# Patient Record
Sex: Female | Born: 1980
Health system: Southern US, Community
[De-identification: ages and names within clinical notes are randomized; demographics above are authoritative.]

## PROBLEM LIST (undated history)

## (undated) ENCOUNTER — Inpatient Hospital Stay (HOSPITAL_COMMUNITY): Payer: Self-pay

## (undated) DIAGNOSIS — O139 Gestational [pregnancy-induced] hypertension without significant proteinuria, unspecified trimester: Secondary | ICD-10-CM

## (undated) DIAGNOSIS — F419 Anxiety disorder, unspecified: Secondary | ICD-10-CM

## (undated) DIAGNOSIS — B009 Herpesviral infection, unspecified: Secondary | ICD-10-CM

## (undated) DIAGNOSIS — N879 Dysplasia of cervix uteri, unspecified: Secondary | ICD-10-CM

## (undated) DIAGNOSIS — F32A Depression, unspecified: Secondary | ICD-10-CM

## (undated) DIAGNOSIS — R87629 Unspecified abnormal cytological findings in specimens from vagina: Secondary | ICD-10-CM

## (undated) DIAGNOSIS — Z789 Other specified health status: Secondary | ICD-10-CM

## (undated) DIAGNOSIS — Z8619 Personal history of other infectious and parasitic diseases: Secondary | ICD-10-CM

## (undated) DIAGNOSIS — L309 Dermatitis, unspecified: Secondary | ICD-10-CM

## (undated) HISTORY — DX: Dysplasia of cervix uteri, unspecified: N87.9

## (undated) HISTORY — DX: Dermatitis, unspecified: L30.9

## (undated) HISTORY — PX: COLPOSCOPY: SHX161

## (undated) HISTORY — DX: Herpesviral infection, unspecified: B00.9

## (undated) HISTORY — PX: ESOPHAGOGASTRODUODENOSCOPY: SHX1529

---
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
## 2002-06-10 DIAGNOSIS — N879 Dysplasia of cervix uteri, unspecified: Secondary | ICD-10-CM

## 2002-06-10 HISTORY — DX: Dysplasia of cervix uteri, unspecified: N87.9

## 2003-03-10 ENCOUNTER — Emergency Department (HOSPITAL_COMMUNITY): Admission: EM | Admit: 2003-03-10 | Discharge: 2003-03-11 | Payer: Self-pay | Admitting: *Deleted

## 2003-05-04 ENCOUNTER — Emergency Department (HOSPITAL_COMMUNITY): Admission: EM | Admit: 2003-05-04 | Discharge: 2003-05-05 | Payer: Self-pay | Admitting: Emergency Medicine

## 2004-07-03 ENCOUNTER — Emergency Department (HOSPITAL_COMMUNITY): Admission: EM | Admit: 2004-07-03 | Discharge: 2004-07-03 | Payer: Self-pay | Admitting: Emergency Medicine

## 2004-07-20 ENCOUNTER — Inpatient Hospital Stay (HOSPITAL_COMMUNITY): Admission: AD | Admit: 2004-07-20 | Discharge: 2004-07-20 | Payer: Self-pay | Admitting: Family Medicine

## 2005-01-14 ENCOUNTER — Inpatient Hospital Stay (HOSPITAL_COMMUNITY): Admission: AD | Admit: 2005-01-14 | Discharge: 2005-01-14 | Payer: Self-pay | Admitting: Obstetrics & Gynecology

## 2005-02-25 ENCOUNTER — Emergency Department (HOSPITAL_COMMUNITY): Admission: EM | Admit: 2005-02-25 | Discharge: 2005-02-25 | Payer: Self-pay | Admitting: Emergency Medicine

## 2005-02-28 ENCOUNTER — Emergency Department (HOSPITAL_COMMUNITY): Admission: EM | Admit: 2005-02-28 | Discharge: 2005-02-28 | Payer: Self-pay | Admitting: *Deleted

## 2005-03-03 ENCOUNTER — Emergency Department (HOSPITAL_COMMUNITY): Admission: EM | Admit: 2005-03-03 | Discharge: 2005-03-03 | Payer: Self-pay | Admitting: Family Medicine

## 2005-04-23 ENCOUNTER — Ambulatory Visit: Payer: Self-pay | Admitting: Internal Medicine

## 2005-06-11 ENCOUNTER — Ambulatory Visit: Payer: Self-pay | Admitting: Internal Medicine

## 2005-07-16 ENCOUNTER — Inpatient Hospital Stay (HOSPITAL_COMMUNITY): Admission: AD | Admit: 2005-07-16 | Discharge: 2005-07-16 | Payer: Self-pay | Admitting: Family Medicine

## 2005-11-28 ENCOUNTER — Emergency Department (HOSPITAL_COMMUNITY): Admission: EM | Admit: 2005-11-28 | Discharge: 2005-11-28 | Payer: Self-pay | Admitting: Family Medicine

## 2006-03-28 ENCOUNTER — Inpatient Hospital Stay (HOSPITAL_COMMUNITY): Admission: AD | Admit: 2006-03-28 | Discharge: 2006-03-28 | Payer: Self-pay | Admitting: Obstetrics & Gynecology

## 2006-07-28 ENCOUNTER — Emergency Department (HOSPITAL_COMMUNITY): Admission: EM | Admit: 2006-07-28 | Discharge: 2006-07-28 | Payer: Self-pay | Admitting: Family Medicine

## 2006-07-29 ENCOUNTER — Emergency Department (HOSPITAL_COMMUNITY): Admission: EM | Admit: 2006-07-29 | Discharge: 2006-07-29 | Payer: Self-pay | Admitting: Emergency Medicine

## 2007-04-10 ENCOUNTER — Emergency Department (HOSPITAL_COMMUNITY): Admission: EM | Admit: 2007-04-10 | Discharge: 2007-04-10 | Payer: Self-pay | Admitting: Emergency Medicine

## 2007-06-01 DIAGNOSIS — J301 Allergic rhinitis due to pollen: Secondary | ICD-10-CM

## 2007-07-20 ENCOUNTER — Emergency Department (HOSPITAL_COMMUNITY): Admission: EM | Admit: 2007-07-20 | Discharge: 2007-07-20 | Payer: Self-pay | Admitting: Emergency Medicine

## 2007-08-28 ENCOUNTER — Emergency Department (HOSPITAL_COMMUNITY): Admission: EM | Admit: 2007-08-28 | Discharge: 2007-08-28 | Payer: Self-pay | Admitting: General Surgery

## 2007-11-13 ENCOUNTER — Emergency Department (HOSPITAL_COMMUNITY): Admission: EM | Admit: 2007-11-13 | Discharge: 2007-11-13 | Payer: Self-pay | Admitting: Emergency Medicine

## 2008-07-30 ENCOUNTER — Emergency Department (HOSPITAL_COMMUNITY): Admission: EM | Admit: 2008-07-30 | Discharge: 2008-07-30 | Payer: Self-pay | Admitting: Emergency Medicine

## 2008-12-18 ENCOUNTER — Emergency Department (HOSPITAL_COMMUNITY): Admission: EM | Admit: 2008-12-18 | Discharge: 2008-12-19 | Payer: Self-pay | Admitting: Emergency Medicine

## 2009-12-04 ENCOUNTER — Encounter: Admission: RE | Admit: 2009-12-04 | Discharge: 2009-12-04 | Payer: Self-pay | Admitting: Specialist

## 2010-09-13 ENCOUNTER — Inpatient Hospital Stay (INDEPENDENT_AMBULATORY_CARE_PROVIDER_SITE_OTHER)
Admission: RE | Admit: 2010-09-13 | Discharge: 2010-09-13 | Disposition: A | Discharge status: Home / Self Care | Payer: 59 | Source: Ambulatory Visit | Attending: Family Medicine | Admitting: Family Medicine

## 2010-09-13 DIAGNOSIS — N390 Urinary tract infection, site not specified: Secondary | ICD-10-CM

## 2010-09-13 LAB — POCT URINALYSIS DIP (DEVICE)
Nitrite: NEGATIVE
Protein, ur: NEGATIVE mg/dL
Urobilinogen, UA: 0.2 mg/dL (ref 0.0–1.0)
pH: 5.5 (ref 5.0–8.0)

## 2010-09-13 LAB — POCT PREGNANCY, URINE: Preg Test, Ur: NEGATIVE

## 2010-09-15 LAB — URINE CULTURE

## 2011-03-01 LAB — CBC
Hemoglobin: 13.9
MCHC: 33.9
MCV: 89.9
Platelets: 235
RDW: 13.6
WBC: 5

## 2011-03-01 LAB — COMPREHENSIVE METABOLIC PANEL
ALT: 9
AST: 14
Alkaline Phosphatase: 62
BUN: 12
Creatinine, Ser: 0.92
Total Bilirubin: 0.8
Total Protein: 7.2

## 2011-03-01 LAB — DIFFERENTIAL
Eosinophils Relative: 1
Monocytes Absolute: 0.5
Monocytes Relative: 9
Neutro Abs: 3.1

## 2011-03-01 LAB — GC/CHLAMYDIA PROBE AMP, GENITAL: GC Probe Amp, Genital: NEGATIVE

## 2011-03-01 LAB — URINALYSIS, ROUTINE W REFLEX MICROSCOPIC
Bilirubin Urine: NEGATIVE
Glucose, UA: NEGATIVE
Nitrite: NEGATIVE
Protein, ur: NEGATIVE

## 2011-03-01 LAB — URINE MICROSCOPIC-ADD ON

## 2011-03-01 LAB — POCT PREGNANCY, URINE: Preg Test, Ur: NEGATIVE

## 2011-03-01 LAB — WET PREP, GENITAL: Yeast Wet Prep HPF POC: NONE SEEN

## 2011-03-07 LAB — POCT RAPID STREP A: Streptococcus, Group A Screen (Direct): NEGATIVE

## 2011-03-27 ENCOUNTER — Inpatient Hospital Stay (INDEPENDENT_AMBULATORY_CARE_PROVIDER_SITE_OTHER)
Admission: RE | Admit: 2011-03-27 | Discharge: 2011-03-27 | Disposition: A | Discharge status: Home / Self Care | Payer: 59 | Source: Ambulatory Visit | Attending: Emergency Medicine | Admitting: Emergency Medicine

## 2011-03-27 DIAGNOSIS — J019 Acute sinusitis, unspecified: Secondary | ICD-10-CM

## 2011-07-18 ENCOUNTER — Encounter (HOSPITAL_COMMUNITY): Payer: Self-pay | Admitting: Emergency Medicine

## 2011-07-18 ENCOUNTER — Emergency Department (HOSPITAL_COMMUNITY)
Admission: EM | Admit: 2011-07-18 | Discharge: 2011-07-19 | Disposition: A | Discharge status: Home / Self Care | Payer: 59 | Attending: Emergency Medicine | Admitting: Emergency Medicine

## 2011-07-18 DIAGNOSIS — M545 Low back pain, unspecified: Secondary | ICD-10-CM | POA: Insufficient documentation

## 2011-07-18 DIAGNOSIS — R109 Unspecified abdominal pain: Secondary | ICD-10-CM | POA: Insufficient documentation

## 2011-07-18 DIAGNOSIS — N390 Urinary tract infection, site not specified: Secondary | ICD-10-CM

## 2011-07-18 DIAGNOSIS — R112 Nausea with vomiting, unspecified: Secondary | ICD-10-CM | POA: Insufficient documentation

## 2011-07-18 DIAGNOSIS — R300 Dysuria: Secondary | ICD-10-CM | POA: Insufficient documentation

## 2011-07-18 DIAGNOSIS — R350 Frequency of micturition: Secondary | ICD-10-CM | POA: Insufficient documentation

## 2011-07-18 DIAGNOSIS — R3915 Urgency of urination: Secondary | ICD-10-CM | POA: Insufficient documentation

## 2011-07-18 NOTE — ED Notes (Signed)
Pt alert, nad, c/o low back pain, urinary s/s, tried OTC remedy w/o success, presents + discharge, low back pain, nausea, denies bleeding, onset several days ago

## 2011-07-18 NOTE — ED Notes (Signed)
[id number]<IO> Expected date:<BR> Expected time:<BR> Means of arrival:<BR> Comments:<BR> For [place]

## 2011-07-19 LAB — URINALYSIS, ROUTINE W REFLEX MICROSCOPIC
Glucose, UA: NEGATIVE mg/dL
Hgb urine dipstick: NEGATIVE
Nitrite: POSITIVE — AB
Specific Gravity, Urine: 1.027 (ref 1.005–1.030)
Urobilinogen, UA: 4 mg/dL — ABNORMAL HIGH (ref 0.0–1.0)
pH: 5 (ref 5.0–8.0)

## 2011-07-19 LAB — PREGNANCY, URINE: Preg Test, Ur: NEGATIVE

## 2011-07-19 MED ORDER — PROMETHAZINE HCL 25 MG PO TABS: 25.0000 mg | ORAL_TABLET | Freq: Four times a day (QID) | ORAL | Status: AC | PRN

## 2011-07-19 MED ORDER — CIPROFLOXACIN HCL 500 MG PO TABS: 500.0000 mg | ORAL_TABLET | Freq: Two times a day (BID) | ORAL | Status: AC

## 2011-07-19 MED ORDER — CIPROFLOXACIN IN D5W 400 MG/200ML IV SOLN
400.0000 mg | Freq: Once | INTRAVENOUS | Status: AC
  Administered 2011-07-19: 400 mg via INTRAVENOUS
  Filled 2011-07-19: qty 200

## 2011-07-19 MED ORDER — SODIUM CHLORIDE 0.9 % IV BOLUS (SEPSIS)
1000.0000 mL | Freq: Once | INTRAVENOUS | Status: AC
  Administered 2011-07-19: 1000 mL via INTRAVENOUS

## 2011-07-19 MED ORDER — PROMETHAZINE HCL 25 MG/ML IJ SOLN
12.5000 mg | Freq: Once | INTRAMUSCULAR | Status: DC
  Filled 2011-07-19: qty 1

## 2011-07-19 NOTE — ED Notes (Signed)
Pt c/o R lower back pain x several days, urinary pain today, pt states she did take OTC urinary tract meds today without relief. Denies fever. Pt states she did have episode of vomiting x 1 .

## 2011-07-19 NOTE — ED Provider Notes (Signed)
History     CSN: [id number]  Arrival date & time 07/18/11  2100   First MD Initiated Contact with Patient 07/19/11 0005      Chief Complaint  Patient presents with  . Back Pain  . Urinary Tract Infection    (Consider location/radiation/quality/duration/timing/severity/associated sxs/prior treatment) Patient is a 31 y.o. female presenting with urinary tract infection. The history is provided by the patient.  Urinary Tract Infection Pertinent negatives include no chest pain, no headaches and no shortness of breath.   dysuria and symptoms started 3 days ago. She is having suprapubic discomfort and some right sided low back pain described as dull in quality. She has been taking over-the-counter medications with persistent symptoms. Tonight she developed nausea and vomiting x1. No bloody or bilious emesis. She denies any fever or chills. Currently mild nausea and minimal discomfort. She declines any pain medicines. No rash. No abdominal pain otherwise. Patient believes she has a UTI with history of same. No known STD exposures. Her urine has also changed color from taking AZO. She has severe penicillin allergy.  History reviewed. No pertinent past medical history.  History reviewed. No pertinent past surgical history.  No family history on file.  History  Substance Use Topics  . Smoking status: Never Smoker   . Smokeless tobacco: Not on file  . Alcohol Use: No    OB History    Grav Para Term Preterm Abortions TAB SAB Ect Mult Living                  Review of Systems  Constitutional: Negative for fever and chills.  HENT: Negative for neck pain and neck stiffness.   Eyes: Negative for pain.  Respiratory: Negative for shortness of breath.   Cardiovascular: Negative for chest pain, palpitations and leg swelling.  Gastrointestinal: Negative for abdominal distention.  Genitourinary: Positive for dysuria, urgency and frequency. Negative for flank pain.  Musculoskeletal: Positive  for back pain.  Skin: Negative for rash.  Neurological: Negative for headaches.  All other systems reviewed and are negative.    Allergies  Penicillins  Home Medications  No current outpatient prescriptions on file.  BP 125/85  Pulse 89  Temp(Src) 97.7 F (36.5 C) (Oral)  Resp 20  Wt 184 lb 15.5 oz (83.9 kg)  SpO2 100%  LMP 07/14/2011  Physical Exam  Constitutional: She is oriented to person, place, and time. She appears well-developed and well-nourished.  HENT:  Head: Normocephalic and atraumatic.  Eyes: Conjunctivae and EOM are normal. Pupils are equal, round, and reactive to light.  Neck: Trachea normal. Neck supple. No thyromegaly present.  Cardiovascular: Normal rate, regular rhythm, S1 normal, S2 normal and normal pulses.     No systolic murmur is present   No diastolic murmur is present  Pulses:      Radial pulses are 2+ on the right side, and 2+ on the left side.  Pulmonary/Chest: Effort normal and breath sounds normal. She has no wheezes. She has no rhonchi. She has no rales. She exhibits no tenderness.  Abdominal: Soft. Normal appearance and bowel sounds are normal. She exhibits no mass. There is no tenderness. There is no rebound, no guarding and negative Murphy's sign.       Mild right CVA tenderness  Musculoskeletal:       BLE:s Calves nontender, no cords or erythema, negative Homans sign  Neurological: She is alert and oriented to person, place, and time. She has normal strength. No cranial nerve deficit or sensory  deficit. GCS eye subscore is 4. GCS verbal subscore is 5. GCS motor subscore is 6.  Skin: Skin is warm and dry. No rash noted. She is not diaphoretic.  Psychiatric: Her speech is normal.       Cooperative and appropriate    ED Course  Procedures (including critical care time)  Labs Reviewed  URINALYSIS, ROUTINE W REFLEX MICROSCOPIC - Abnormal; Notable for the following:    Color, Urine RED (*) BIOCHEMICALS MAY BE AFFECTED BY COLOR    APPearance CLOUDY (*)    Bilirubin Urine SMALL (*)    Ketones, ur 40 (*)    Protein, ur 30 (*)    Urobilinogen, UA 4.0 (*)    Nitrite POSITIVE (*)    Leukocytes, UA MODERATE (*)    All other components within normal limits  URINE MICROSCOPIC-ADD ON - Abnormal; Notable for the following:    Squamous Epithelial / LPF FEW (*)    Bacteria, UA FEW (*)    All other components within normal limits  PREGNANCY, URINE  URINE CULTURE    IV fluids. IV Cipro. Phenergan provided.  MDM   Patient with UTI symptoms and nitrite positive urine as above. Antibiotics initiated and urine culture sent. Patient has very mild symptoms of pyelonephritis- single episode of vomiting and no fevers.  She is currently appropriate for outpatient treatment of symptoms. She is followed by Dr. Mayford Knife on [address] and agrees to all discharge and followup instructions, including strict return precautions.        Sunnie Nielsen, MD 07/19/11 [phone]

## 2011-07-19 NOTE — ED Notes (Signed)
Patient is alert and oriented x3.  She was given DC instructions and follow up visit instructions.  Patient gave verbal understanding. She was DC ambulatory under his own power to home.  V/S stable.  He was not showing any signs of distress on DC 

## 2011-07-20 LAB — URINE CULTURE
Colony Count: NO GROWTH
Culture  Setup Time: 201302080448

## 2012-07-16 ENCOUNTER — Ambulatory Visit (INDEPENDENT_AMBULATORY_CARE_PROVIDER_SITE_OTHER): Payer: 59 | Admitting: Gynecology

## 2012-07-16 ENCOUNTER — Encounter: Payer: Self-pay | Admitting: Gynecology

## 2012-07-16 VITALS — BP 118/74 | Ht 66.0 in | Wt 187.0 lb

## 2012-07-16 DIAGNOSIS — A499 Bacterial infection, unspecified: Secondary | ICD-10-CM

## 2012-07-16 DIAGNOSIS — B3731 Acute candidiasis of vulva and vagina: Secondary | ICD-10-CM

## 2012-07-16 DIAGNOSIS — B373 Candidiasis of vulva and vagina: Secondary | ICD-10-CM

## 2012-07-16 DIAGNOSIS — N760 Acute vaginitis: Secondary | ICD-10-CM

## 2012-07-16 DIAGNOSIS — N898 Other specified noninflammatory disorders of vagina: Secondary | ICD-10-CM

## 2012-07-16 DIAGNOSIS — B9689 Other specified bacterial agents as the cause of diseases classified elsewhere: Secondary | ICD-10-CM

## 2012-07-16 DIAGNOSIS — B009 Herpesviral infection, unspecified: Secondary | ICD-10-CM | POA: Insufficient documentation

## 2012-07-16 LAB — WET PREP FOR TRICH, YEAST, CLUE

## 2012-07-16 MED ORDER — METRONIDAZOLE 500 MG PO TABS: 500.0000 mg | ORAL_TABLET | Freq: Two times a day (BID) | ORAL | Status: DC

## 2012-07-16 MED ORDER — FLUCONAZOLE 200 MG PO TABS: 200.0000 mg | ORAL_TABLET | Freq: Every day | ORAL | Status: DC

## 2012-07-16 NOTE — Patient Instructions (Signed)
Take Flagyl medication twice daily for 7 days, avoid alcohol while taking. Take Diflucan pill once a day for 3 days. Follow up if symptoms persist, worsen or return.

## 2012-07-16 NOTE — Progress Notes (Signed)
Teresa Harvey June 16, 1980 [id number]        32 y.o.  G2P0020 new patient complaining of one week of vaginal discharge and itching. She used an OTC Monistat without relief. She notes recurrent vaginal discharge several days before each menses which resolves with her period. Currently on oral contraceptives since December with regular menses.  Last annual exam August 2013. History of dysplasia of 2004 with colposcopy and subsequent expectant management with normal Pap smears since then.  Past medical history,surgical history, medications, allergies, family history and social history were all reviewed and documented in the EPIC chart. ROS:  Was performed and pertinent positives and negatives are included in the history.  Exam: Kim assistant Filed Vitals:   07/16/12 0821  BP: 118/74  Height: 5\' 6"  (1.676 m)  Weight: 187 lb (84.823 kg)   General appearance  Normal Skin grossly normal Abdominal  soft, nontender, without masses, organomegaly or hernia Pelvic  Ext/BUS/vagina  White thick discharge  Cervix  normal high in the vaginal vault  Uterus  axial, normal size, shape and contour, midline and mobile nontender   Adnexa  Without masses or tenderness    Anus and perineum  normal   Rectovaginal  normal sphincter tone without palpated masses or tenderness.    Assessment/Plan:  32 y.o. G2P0020 new patient.  One-week history of vaginal discharge. Exam and wet prep consistent with yeast. Also questionable low-grade bacterial vaginosis difficult to interpret due to presence of cream. Will cover both with Diflucan 200 mg daily x3 days and Flagyl 500 mg twice a day x7 days, alcohol was reviewed. Hopefully we'll eradicate any reserve leading to recurrent infections. Follow up if symptoms persist or recur. Otherwise follow up in August when she is due for her annual exam.    Dara Lords MD, 8:54 AM 07/16/2012

## 2012-07-28 ENCOUNTER — Ambulatory Visit: Payer: 59 | Admitting: Gynecology

## 2012-12-10 ENCOUNTER — Ambulatory Visit (INDEPENDENT_AMBULATORY_CARE_PROVIDER_SITE_OTHER): Payer: 59 | Admitting: Gynecology

## 2012-12-10 ENCOUNTER — Encounter: Payer: Self-pay | Admitting: Gynecology

## 2012-12-10 DIAGNOSIS — R102 Pelvic and perineal pain: Secondary | ICD-10-CM

## 2012-12-10 DIAGNOSIS — N898 Other specified noninflammatory disorders of vagina: Secondary | ICD-10-CM

## 2012-12-10 DIAGNOSIS — N949 Unspecified condition associated with female genital organs and menstrual cycle: Secondary | ICD-10-CM

## 2012-12-10 DIAGNOSIS — N9489 Other specified conditions associated with female genital organs and menstrual cycle: Secondary | ICD-10-CM

## 2012-12-10 LAB — WET PREP FOR TRICH, YEAST, CLUE: Yeast Wet Prep HPF POC: NONE SEEN

## 2012-12-10 MED ORDER — CLINDAMYCIN PHOSPHATE 2 % VA CREA: 1.0000 | TOPICAL_CREAM | Freq: Every day | VAGINAL | Status: DC

## 2012-12-10 MED ORDER — METRONIDAZOLE 500 MG PO TABS: 500.0000 mg | ORAL_TABLET | Freq: Two times a day (BID) | ORAL | Status: DC

## 2012-12-10 NOTE — Progress Notes (Signed)
Patient presents complaining of the recurrent vaginal discharge with odor. Some irritation but no real itching. Occurs every month several days before her menses and then resolves when her period starts. Also feeling more pelvic pressure with her menses over the last several months. Having regular monthly menses on oral contraceptives and otherwise doing well. Has annual exam scheduled for next month.  Exam was Berenice Bouton Abdomen soft nontender without masses guarding rebound organomegaly. Pelvic external BUS vagina with frothy white discharge. Cervix normal. Uterus normal size mobile nontender. Adnexa without masses or tenderness.  Assessment and plan: Wet prep, exam and history consistent with bacterial vaginosis. Will treat with an extended course of Flagyl 500 mg twice a day x14 days. Alcohol avoidance reviewed. We'll then use Cleocin vaginal cream 1 applicator several days to a week before her menses to see if this doesn't prophylactically prevent a recurrence. She'll use the 7 applicators in the kit over the next 7 months and see how she does. Given her pelvic pressure symptoms will start with ultrasound just to rule out nonpalpable abnormality she should followup for this. Ultimately she'll followup in another month or so for her annual exam also.

## 2012-12-10 NOTE — Patient Instructions (Addendum)
Take Flagyl pill twice daily for 14 days. Use Cleocin vaginal applicator once several days to week before your anticipated menses to see if this doesn't prevent the vaginal discharge from coming. Followup for your ultrasound as scheduled. Followup for your annual exam in one month.

## 2012-12-11 LAB — URINALYSIS W MICROSCOPIC + REFLEX CULTURE
Bacteria, UA: NONE SEEN
Casts: NONE SEEN
Hgb urine dipstick: NEGATIVE
Ketones, ur: NEGATIVE mg/dL
Leukocytes, UA: NEGATIVE
Nitrite: NEGATIVE
Specific Gravity, Urine: >1.03 — ABNORMAL HIGH (ref 1.005–1.030)
Urobilinogen, UA: 0.2 mg/dL (ref 0.0–1.0)
pH: 6 (ref 5.0–8.0)

## 2012-12-11 LAB — GC/CHLAMYDIA PROBE AMP
CT Probe RNA: NEGATIVE
GC Probe RNA: NEGATIVE

## 2012-12-30 ENCOUNTER — Ambulatory Visit (INDEPENDENT_AMBULATORY_CARE_PROVIDER_SITE_OTHER): Payer: 59 | Admitting: Gynecology

## 2012-12-30 ENCOUNTER — Ambulatory Visit (INDEPENDENT_AMBULATORY_CARE_PROVIDER_SITE_OTHER): Payer: 59

## 2012-12-30 ENCOUNTER — Encounter: Payer: Self-pay | Admitting: Gynecology

## 2012-12-30 DIAGNOSIS — R102 Pelvic and perineal pain: Secondary | ICD-10-CM

## 2012-12-30 DIAGNOSIS — N831 Corpus luteum cyst of ovary, unspecified side: Secondary | ICD-10-CM

## 2012-12-30 DIAGNOSIS — R188 Other ascites: Secondary | ICD-10-CM

## 2012-12-30 DIAGNOSIS — N8300 Follicular cyst of ovary, unspecified side: Secondary | ICD-10-CM

## 2012-12-30 DIAGNOSIS — N9489 Other specified conditions associated with female genital organs and menstrual cycle: Secondary | ICD-10-CM

## 2012-12-30 DIAGNOSIS — N949 Unspecified condition associated with female genital organs and menstrual cycle: Secondary | ICD-10-CM

## 2012-12-30 DIAGNOSIS — N83209 Unspecified ovarian cyst, unspecified side: Secondary | ICD-10-CM

## 2012-12-30 NOTE — Progress Notes (Signed)
Patient follows up for ultrasound due to history of pelvic pressure. Her vaginal discharge has cleared.  Ultrasound shows uterus normal size and echotexture. Endometrial echo 6.4 mm. Left ovary normal right ovary with wall cystic mass peripheral flow consistent with corpus luteum mean diameter 25 mm. Cul-de-sac negative.  Assessment and plan: Ultrasound normal. Reviewed probable corpus luteum right ovary with the patient. Classic in appearance. Only other in differential would be endometrioma but does not appear to be. For completeness we'll plan on re\re ultrasound in 3 months just to relook at this area. She is scheduled to see me next month for her annual followup for this but otherwise will schedule a repeat ultrasound in 3 months. Also give me a chance to followup on her pressure symptoms. Possible laparoscopy discussed. Her GC, Chlamydia and urinalysis were negative from her prior visit.

## 2012-12-30 NOTE — Patient Instructions (Signed)
Follow up for annual exam next month then ultrasound in 3 months

## 2013-01-11 ENCOUNTER — Encounter: Payer: Self-pay | Admitting: Gynecology

## 2013-01-11 ENCOUNTER — Encounter: Payer: 59 | Admitting: Gynecology

## 2013-01-11 MED ORDER — FLUCONAZOLE 150 MG PO TABS: 150.0000 mg | ORAL_TABLET | Freq: Once | ORAL | Status: DC

## 2013-01-11 NOTE — Telephone Encounter (Signed)
Patient put through patient portal question about yeast infection. Okay to prescribe Diflucan 150 mg x1 dose. Let patient know we did so.

## 2013-01-26 ENCOUNTER — Ambulatory Visit (INDEPENDENT_AMBULATORY_CARE_PROVIDER_SITE_OTHER): Payer: 59 | Admitting: Gynecology

## 2013-01-26 ENCOUNTER — Other Ambulatory Visit (HOSPITAL_COMMUNITY)
Admission: RE | Admit: 2013-01-26 | Discharge: 2013-01-26 | Disposition: A | Discharge status: Home / Self Care | Payer: 59 | Source: Ambulatory Visit | Attending: Gynecology | Admitting: Gynecology

## 2013-01-26 ENCOUNTER — Encounter: Payer: Self-pay | Admitting: Gynecology

## 2013-01-26 VITALS — BP 120/78 | Ht 66.5 in | Wt 182.0 lb

## 2013-01-26 DIAGNOSIS — Z01419 Encounter for gynecological examination (general) (routine) without abnormal findings: Secondary | ICD-10-CM

## 2013-01-26 DIAGNOSIS — A6000 Herpesviral infection of urogenital system, unspecified: Secondary | ICD-10-CM

## 2013-01-26 DIAGNOSIS — Z13220 Encounter for screening for lipoid disorders: Secondary | ICD-10-CM

## 2013-01-26 DIAGNOSIS — Z1151 Encounter for screening for human papillomavirus (HPV): Secondary | ICD-10-CM | POA: Insufficient documentation

## 2013-01-26 DIAGNOSIS — Z309 Encounter for contraceptive management, unspecified: Secondary | ICD-10-CM

## 2013-01-26 MED ORDER — VALACYCLOVIR HCL 1 G PO TABS: 500.0000 mg | ORAL_TABLET | Freq: Every day | ORAL | Status: DC

## 2013-01-26 MED ORDER — NORETHIN ACE-ETH ESTRAD-FE 1-20 MG-MCG(24) PO CHEW: 1.0000 | CHEWABLE_TABLET | Freq: Every day | ORAL | Status: DC

## 2013-01-26 NOTE — Addendum Note (Signed)
Addended by: Dayna Barker on: 01/26/2013 11:36 AM   Modules accepted: Orders

## 2013-01-26 NOTE — Patient Instructions (Signed)
Followup in 2 months for ultrasound as scheduled. Sooner if pain worsens or changes.

## 2013-01-26 NOTE — Progress Notes (Signed)
Teresa Harvey 1980-09-21 [id number]        32 y.o.  G2P0020 for annual exam.    Past medical history,surgical history, medications, allergies, family history and social history were all reviewed and documented in the EPIC chart.  ROS:  Performed and pertinent positives and negatives are included in the history, assessment and plan .  Exam: Kim assistant Filed Vitals:   01/26/13 1101  BP: 120/78  Height: 5' 6.5" (1.689 m)  Weight: 182 lb (82.555 kg)   General appearance  Normal Skin grossly normal Head/Neck normal with no cervical or supraclavicular adenopathy thyroid normal Lungs  clear Cardiac RR, without RMG Abdominal  soft, nontender, without masses, organomegaly or hernia Breasts  examined lying and sitting without masses, retractions, discharge or axillary adenopathy. Pelvic  Ext/BUS/vagina  normal  Cervix  normal, Pap/HPV  Uterus  anteverted, normal size, shape and contour, midline and mobile nontender   Adnexa  Without masses or tenderness    Anus and perineum  normal   Rectovaginal  normal sphincter tone without palpated masses or tenderness.    Assessment/Plan:  32 y.o. G82P0020 female for annual exam, regular menses, oral contraceptives.   1. Oral contraceptives. Patient on Loestrin 120 equivalent. Doing well wants to continue. Risks of stroke heart attack DVT reviewed. Does not smoke nor being followed for any medical issues. Patient understands and accepts the risks and I refilled her x1 year. 2. Pelvic pressure. Primarily with her menses. Recent ultrasound was normal with physiologic cyst on her ovary. She is planned for 2 month followup ultrasound to make sure this area resolves. If discomfort continues possible laparoscopy reviewed to rule out endometriosis. If pain worsens or changes before then she'll represent for evaluation. 3. Pap smear 2013. Pap/HPV done today. History of cervical dysplasia 2004 with no treatment and normal paps historically. I do not have  copies of these and that is why did the Pap/HPV. Assuming normal in plan less frequent screening interval. 4. Breast health. SBE monthly reviewed. 5. History of genital herpes. Taking Valtrex 1000 mg daily as suppression. Last outbreak approximately a year ago. Recommended decreasing to 500 mg daily and see how she does with this. Refill x1 year provided. 6. Health maintenance. Baseline CBC, comprehensive metabolic panel, lipid profile and urinalysis ordered. Followup for ultrasound in 2 months and we'll go from there.  Note: This document was prepared with digital dictation and possible smart phrase technology. Any transcriptional errors that result from this process are unintentional.   Dara Lords MD, 11:29 AM 01/26/2013

## 2013-01-27 ENCOUNTER — Other Ambulatory Visit: Payer: 59

## 2013-01-27 LAB — CBC WITH DIFFERENTIAL/PLATELET
Basophils Absolute: 0 10*3/uL (ref 0.0–0.1)
Basophils Relative: 1 % (ref 0–1)
Eosinophils Relative: 1 % (ref 0–5)
Hemoglobin: 13.2 g/dL (ref 12.0–15.0)
Lymphocytes Relative: 18 % (ref 12–46)
Lymphs Abs: 1.6 10*3/uL (ref 0.7–4.0)
MCH: 30.4 pg (ref 26.0–34.0)
Monocytes Absolute: 0.7 10*3/uL (ref 0.1–1.0)
Monocytes Relative: 8 % (ref 3–12)
Neutro Abs: 6.5 10*3/uL (ref 1.7–7.7)
Neutrophils Relative %: 72 % (ref 43–77)
Platelets: 267 10*3/uL (ref 150–400)
RBC: 4.34 MIL/uL (ref 3.87–5.11)
WBC: 8.9 10*3/uL (ref 4.0–10.5)

## 2013-01-27 LAB — URINALYSIS W MICROSCOPIC + REFLEX CULTURE
Bacteria, UA: NONE SEEN
Glucose, UA: NEGATIVE mg/dL
Hgb urine dipstick: NEGATIVE
Ketones, ur: NEGATIVE mg/dL
Leukocytes, UA: NEGATIVE
Nitrite: NEGATIVE
Protein, ur: NEGATIVE mg/dL

## 2013-01-27 LAB — COMPREHENSIVE METABOLIC PANEL
AST: 17 U/L (ref 0–37)
Alkaline Phosphatase: 42 U/L (ref 39–117)
BUN: 16 mg/dL (ref 6–23)
Calcium: 9.1 mg/dL (ref 8.4–10.5)
Creat: 0.89 mg/dL (ref 0.50–1.10)

## 2013-01-27 LAB — LIPID PANEL
HDL: 52 mg/dL (ref >39)
LDL Cholesterol: 64 mg/dL (ref 0–99)
Total CHOL/HDL Ratio: 2.4 Ratio

## 2013-02-02 ENCOUNTER — Encounter: Payer: Self-pay | Admitting: Gynecology

## 2013-02-05 ENCOUNTER — Encounter: Payer: Self-pay | Admitting: Gynecology

## 2013-02-05 ENCOUNTER — Telehealth: Payer: Self-pay

## 2013-02-05 ENCOUNTER — Other Ambulatory Visit: Payer: Self-pay | Admitting: Gynecology

## 2013-02-05 MED ORDER — NORETHIN ACE-ETH ESTRAD-FE 1-20 MG-MCG PO TABS: 1.0000 | ORAL_TABLET | Freq: Every day | ORAL | Status: DC

## 2013-02-05 MED ORDER — VALACYCLOVIR HCL 1 G PO TABS: 1000.0000 mg | ORAL_TABLET | Freq: Every day | ORAL | Status: DC

## 2013-02-05 NOTE — Telephone Encounter (Signed)
Wants generic bcp.  The Minastin prescribed at visit does not have generic available and is too expensive. Rx?

## 2013-02-05 NOTE — Telephone Encounter (Signed)
LoEstrin 1/20 equivalent generic okay Valtrex 1 g daily okay

## 2013-02-05 NOTE — Telephone Encounter (Signed)
Patient informed. Rx's escribed. (re-escribed her Valtrex with one tab daily instruction)

## 2013-02-05 NOTE — Telephone Encounter (Signed)
1.  Requesting generic bcp as the Minastrin you prescribed at office visit is too expensive. Rx?  2. Also, patient said she had been taking Valtrex tab daily and had not had outbreak in [address]. She said you asked her to go to 1/2 tab daily and she did. Since yesterday feels irritated and like outbreak coming. Wants to know if ok to resume takine one every day. Makes her nervous trying 1/2 daily.

## 2013-04-02 ENCOUNTER — Other Ambulatory Visit: Payer: 59

## 2013-04-02 ENCOUNTER — Ambulatory Visit: Payer: 59 | Admitting: Gynecology

## 2013-04-15 ENCOUNTER — Other Ambulatory Visit: Payer: Self-pay

## 2013-06-22 ENCOUNTER — Telehealth: Payer: Self-pay | Admitting: *Deleted

## 2013-06-22 ENCOUNTER — Ambulatory Visit (INDEPENDENT_AMBULATORY_CARE_PROVIDER_SITE_OTHER): Payer: 59 | Admitting: Gynecology

## 2013-06-22 ENCOUNTER — Encounter: Payer: Self-pay | Admitting: Gynecology

## 2013-06-22 DIAGNOSIS — M549 Dorsalgia, unspecified: Secondary | ICD-10-CM

## 2013-06-22 DIAGNOSIS — N898 Other specified noninflammatory disorders of vagina: Secondary | ICD-10-CM

## 2013-06-22 DIAGNOSIS — N83209 Unspecified ovarian cyst, unspecified side: Secondary | ICD-10-CM

## 2013-06-22 LAB — URINALYSIS W MICROSCOPIC + REFLEX CULTURE
BILIRUBIN URINE: NEGATIVE
CRYSTALS: NONE SEEN
Glucose, UA: NEGATIVE mg/dL
KETONES UR: NEGATIVE mg/dL
Nitrite: NEGATIVE
PH: 5 (ref 5.0–8.0)
Protein, ur: NEGATIVE mg/dL
SPECIFIC GRAVITY, URINE: 1.025 (ref 1.005–1.030)
Urobilinogen, UA: 0.2 mg/dL (ref 0.0–1.0)

## 2013-06-22 LAB — WET PREP FOR TRICH, YEAST, CLUE
Trich, Wet Prep: NONE SEEN
Yeast Wet Prep HPF POC: NONE SEEN

## 2013-06-22 MED ORDER — METRONIDAZOLE 500 MG PO TABS: 500.0000 mg | ORAL_TABLET | Freq: Two times a day (BID) | ORAL | Status: DC

## 2013-06-22 NOTE — Patient Instructions (Signed)
Take Flagyl medication twice daily for 7 days. Avoid alcohol while taking. Followup if her vaginal discharge continues, worsens or recurs. Followup for ultrasound as scheduled.

## 2013-06-22 NOTE — Telephone Encounter (Signed)
Pt asked if ultrasound could be done today with OV. Pt informed no ultrasound. Pt will see TF today.

## 2013-06-22 NOTE — Progress Notes (Signed)
Patient presents having regular menses on oral contraceptives until December. She started bleeding several days before her menses was to start and stop and had a heavy bleed after that. She restarted her pills on time and has done no further bleeding but notes a vaginal discharge with odor since then. Also notes some right pelvic pressure and she wonders whether she has an ovarian cyst. She was supposed to followup for repeat ultrasound noting ultrasound in July showed a cystic area on her right ovary suspicious for corpus luteum but she was to repeat the ultrasound in 2 months but did not return for this. She was not having any pain previously but this discomfort started over the last 2 weeks. She is having some low back pain. No dysuria frequency urgency. No fever chills nausea vomiting diarrhea or constipation.  Exam with Berenice BoutonKim Assistant Spine straight without CVA tenderness. Abdomen soft nontender without masses guarding rebound or organomegaly. Pelvic external BUS vagina with yellowish discharge. Cervix normal. Uterus normal size midline mobile nontender. Adnexa without masses or tenderness.  Assessment and plan: 1. Single episode of irregular bleeding on BCPs. Patient will continue on this pack of oral contraceptives. Assuming regular menses then will follow. If irregularity continues will represent for further evaluation. 2. Right pelvic discomfort. History of right ovarian cyst. Repeat ultrasound now and patient will followup for this. Possibilities reviewed up to and including laparoscopy. If ultrasound is normal then we'll plan expectant management if discomfort continues then we'll consider laparoscopy. 3. Vaginal discharge. Wet prep consistent with bacterial vaginosis. Will treat with Flagyl 500 mg twice a day x7 days, alcohol avoidance reviewed. Followup if symptoms persist, worsen or recur. 4. Urinalysis suspicious for low-grade UTI. Will await culture results and treat accordingly.

## 2013-06-24 LAB — URINE CULTURE
Colony Count: NO GROWTH
Organism ID, Bacteria: NO GROWTH

## 2013-07-02 ENCOUNTER — Encounter: Payer: Self-pay | Admitting: Gynecology

## 2013-07-05 ENCOUNTER — Ambulatory Visit (INDEPENDENT_AMBULATORY_CARE_PROVIDER_SITE_OTHER): Payer: 59 | Admitting: Gynecology

## 2013-07-05 ENCOUNTER — Other Ambulatory Visit: Payer: Self-pay | Admitting: Gynecology

## 2013-07-05 ENCOUNTER — Encounter: Payer: Self-pay | Admitting: Gynecology

## 2013-07-05 ENCOUNTER — Ambulatory Visit (INDEPENDENT_AMBULATORY_CARE_PROVIDER_SITE_OTHER): Payer: 59

## 2013-07-05 DIAGNOSIS — N8300 Follicular cyst of ovary, unspecified side: Secondary | ICD-10-CM

## 2013-07-05 DIAGNOSIS — Z113 Encounter for screening for infections with a predominantly sexual mode of transmission: Secondary | ICD-10-CM

## 2013-07-05 DIAGNOSIS — N83209 Unspecified ovarian cyst, unspecified side: Secondary | ICD-10-CM

## 2013-07-05 DIAGNOSIS — M549 Dorsalgia, unspecified: Secondary | ICD-10-CM

## 2013-07-05 DIAGNOSIS — N912 Amenorrhea, unspecified: Secondary | ICD-10-CM

## 2013-07-05 LAB — HEPATITIS C ANTIBODY: HCV AB: NEGATIVE

## 2013-07-05 LAB — RPR

## 2013-07-05 LAB — HIV ANTIBODY (ROUTINE TESTING W REFLEX): HIV: NONREACTIVE

## 2013-07-05 LAB — HEPATITIS B SURFACE ANTIGEN: HEP B S AG: NEGATIVE

## 2013-07-05 LAB — HCG, SERUM, QUALITATIVE: PREG SERUM: NEGATIVE

## 2013-07-05 NOTE — Patient Instructions (Signed)
Followup in August for your annual exam, sooner if you continue to skipped periods.

## 2013-07-05 NOTE — Progress Notes (Signed)
Patient presents for ultrasound due to some right pelvic discomfort and history of prior ovarian cyst. Notes that her discomfort has resolved. Was due for her menses last week but did not start. Reports taking all of her contraceptive pills.  Ultrasound shows uterus normal size. Endometrial echo 5.7 mm. Right and left ovaries visualized with follicles but no significant cystic changes. Cul-de-sac negative.  Assessment and plan: Normal ultrasound. Pain resolved. Will observe at present. Skipped menses which reported taking of her birth control pills. Check qualitative hCG now. Continue on oral contraceptives at present. Call if does not have menses next month. Patient did request STD serum screening. Had negative GC/chlamydia in July. Thought she had serum screening done which was not and she asked to have this. No known exposure. HIV RPR hepatitis B hepatitis C drawn. Followup in August for her routine annual, sooner as needed.

## 2013-09-13 ENCOUNTER — Ambulatory Visit (INDEPENDENT_AMBULATORY_CARE_PROVIDER_SITE_OTHER): Payer: 59 | Admitting: Gynecology

## 2013-09-13 ENCOUNTER — Encounter: Payer: Self-pay | Admitting: Gynecology

## 2013-09-13 DIAGNOSIS — N760 Acute vaginitis: Secondary | ICD-10-CM

## 2013-09-13 DIAGNOSIS — N898 Other specified noninflammatory disorders of vagina: Secondary | ICD-10-CM

## 2013-09-13 DIAGNOSIS — A499 Bacterial infection, unspecified: Secondary | ICD-10-CM

## 2013-09-13 DIAGNOSIS — B3731 Acute candidiasis of vulva and vagina: Secondary | ICD-10-CM

## 2013-09-13 DIAGNOSIS — B373 Candidiasis of vulva and vagina: Secondary | ICD-10-CM

## 2013-09-13 DIAGNOSIS — B9689 Other specified bacterial agents as the cause of diseases classified elsewhere: Secondary | ICD-10-CM

## 2013-09-13 LAB — WET PREP FOR TRICH, YEAST, CLUE: Trich, Wet Prep: NONE SEEN

## 2013-09-13 MED ORDER — METRONIDAZOLE 500 MG PO TABS: 500.0000 mg | ORAL_TABLET | Freq: Two times a day (BID) | ORAL | Status: DC

## 2013-09-13 MED ORDER — FLUCONAZOLE 200 MG PO TABS: 200.0000 mg | ORAL_TABLET | Freq: Every day | ORAL | Status: DC

## 2013-09-13 NOTE — Progress Notes (Signed)
Jefferson FuelBillie L Benavidez Oct 11, 1980 [id number]        33 y.o.  G2P0020 several day history of vaginal discharge with odor. Also some itching. No urinary symptoms such as frequency dysuria urgency.  Past medical history,surgical history, problem list, medications, allergies, family history and social history were all reviewed and documented in the EPIC chart.  Exam: Kim assistant General appearance  Normal Spine straight without CVA tenderness Abdomen soft nontender without masses guarding rebound organomegaly. Pelvic external BUS vagina with thick white discharge. Cervix normal. Uterus normal size, mobile nontender. Adnexa without masses or tenderness.  Assessment/Plan:  33 y.o. G2P0020 exam and wet prep consistent with both yeast and bacterial vaginosis. Treat with Flagyl 500 mg twice a day x7 days, alcohol avoidance reviewed. Diflucan 200 mg daily for 3 days. Followup if symptoms persist, worsen or recur.   Note: This document was prepared with digital dictation and possible smart phrase technology. Any transcriptional errors that result from this process are unintentional.   Dara LordsFONTAINE,Tene Gato P MD, 12:06 PM 09/13/2013

## 2013-09-13 NOTE — Patient Instructions (Signed)
Date Flagyl medication twice daily for 7 days, avoid alcohol while taking. Take Diflucan pill daily for 3 days. Followup if symptoms persist, worsen or recur.

## 2013-09-14 ENCOUNTER — Ambulatory Visit: Payer: 59 | Admitting: Women's Health

## 2013-09-16 ENCOUNTER — Encounter: Payer: Self-pay | Admitting: Women's Health

## 2013-09-17 ENCOUNTER — Other Ambulatory Visit: Payer: Self-pay | Admitting: Women's Health

## 2013-09-17 MED ORDER — VALACYCLOVIR HCL 1 G PO TABS: ORAL_TABLET | ORAL | Status: DC

## 2013-09-20 ENCOUNTER — Encounter: Payer: Self-pay | Admitting: Women's Health

## 2014-01-27 ENCOUNTER — Ambulatory Visit (INDEPENDENT_AMBULATORY_CARE_PROVIDER_SITE_OTHER): Payer: 59 | Admitting: Gynecology

## 2014-01-27 ENCOUNTER — Encounter: Payer: Self-pay | Admitting: Gynecology

## 2014-01-27 VITALS — BP 120/76 | Ht 66.0 in | Wt 217.0 lb

## 2014-01-27 DIAGNOSIS — B009 Herpesviral infection, unspecified: Secondary | ICD-10-CM

## 2014-01-27 DIAGNOSIS — Z113 Encounter for screening for infections with a predominantly sexual mode of transmission: Secondary | ICD-10-CM

## 2014-01-27 DIAGNOSIS — A609 Anogenital herpesviral infection, unspecified: Secondary | ICD-10-CM

## 2014-01-27 DIAGNOSIS — Z01419 Encounter for gynecological examination (general) (routine) without abnormal findings: Secondary | ICD-10-CM

## 2014-01-27 LAB — CBC WITH DIFFERENTIAL/PLATELET
Basophils Absolute: 0.1 10*3/uL (ref 0.0–0.1)
Basophils Relative: 1 % (ref 0–1)
EOS ABS: 0.1 10*3/uL (ref 0.0–0.7)
EOS PCT: 1 % (ref 0–5)
HCT: 40.4 % (ref 36.0–46.0)
Hemoglobin: 14 g/dL (ref 12.0–15.0)
Lymphocytes Relative: 26 % (ref 12–46)
Lymphs Abs: 2.2 10*3/uL (ref 0.7–4.0)
MCH: 31.1 pg (ref 26.0–34.0)
MCHC: 34.7 g/dL (ref 30.0–36.0)
MCV: 89.8 fL (ref 78.0–100.0)
Monocytes Absolute: 0.6 10*3/uL (ref 0.1–1.0)
Monocytes Relative: 7 % (ref 3–12)
NEUTROS PCT: 65 % (ref 43–77)
Neutro Abs: 5.6 10*3/uL (ref 1.7–7.7)
Platelets: 319 10*3/uL (ref 150–400)
RBC: 4.5 MIL/uL (ref 3.87–5.11)
RDW: 13.1 % (ref 11.5–15.5)
WBC: 8.6 10*3/uL (ref 4.0–10.5)

## 2014-01-27 NOTE — Progress Notes (Signed)
Teresa FuelBillie L Harvey 1981-02-07 [id number]        33 y.o.  G2P0020 for annual exam.  Several issues noted below.  Past medical history,surgical history, problem list, medications, allergies, family history and social history were all reviewed and documented as reviewed in the EPIC chart.  ROS:  12 system ROS performed with pertinent positives and negatives included in the history, assessment and plan.   Additional significant findings :  None   Exam: [facility] personassistant Filed Vitals:   01/27/14 1454  BP: 120/76  Height: 5\' 6"  (1.676 m)  Weight: 217 lb (98.431 kg)   General appearance:  Normal affect, orientation and appearance. Skin: Grossly normal HEENT: Without gross lesions.  No cervical or supraclavicular adenopathy. Thyroid normal.  Lungs:  Clear without wheezing, rales or rhonchi Cardiac: RR, without RMG Abdominal:  Soft, nontender, without masses, guarding, rebound, organomegaly or hernia Breasts:  Examined lying and sitting without masses, retractions, discharge or axillary adenopathy. Pelvic:  Ext/BUS/vagina normal  Cervix normal. GC/Chlamydia  Uterus  anteverted, normal size, shape and contour, midline and mobile nontender   Adnexa  Without masses or tenderness    Anus and perineum  Normal   Rectovaginal  Normal sphincter tone without palpated masses or tenderness.    Assessment/Plan:  33 y.o. 142P0020 female for annual exam with regular menses, condom contraception.   1. Contraception. Patient stopped her oral contraceptives. She felt it was contributing to her recurrent vaginitis and seems to be doing better since stopping him. She's comfortable with condoms. I did discuss the increased failure risk associated with condoms and availability of plan B. 2. STD screening. Patient requests STD screening. No known exposure but wants to be screened. GC/Chlamydia, hepatitis B/C., HIV, RPR done. 3. Pap smear/HPV negative 2014.  No Pap smear is done today. History of dysplasia 2004 with  no treatment and normal Pap smears afterwards historically. Plan repeat at 3-5 year interval per current screening guidelines. 4. Breast health. SBE monthly reviewed. Plan mammogram closer to 39. 5. Health maintenance. Baseline CBC comprehensive metabolic panel lipid profile urinalysis ordered with above STD blood work. Followup one year, sooner as needed.   Note: This document was prepared with digital dictation and possible smart phrase technology. Any transcriptional errors that result from this process are unintentional.   Dara LordsFONTAINE,Chase Knebel P MD, 3:19 PM 01/27/2014

## 2014-01-27 NOTE — Patient Instructions (Signed)
You may obtain a copy of any labs that were done today by logging onto MyChart as outlined in the instructions provided with your AVS (after visit summary). The office will not call with normal lab results but certainly if there are any significant abnormalities then we will contact you.   Health Maintenance, Female A healthy lifestyle and preventative care can promote health and wellness.  Maintain regular health, dental, and eye exams.  Eat a healthy diet. Foods like vegetables, fruits, whole grains, low-fat dairy products, and lean protein foods contain the nutrients you need without too many calories. Decrease your intake of foods high in solid fats, added sugars, and salt. Get information about a proper diet from your caregiver, if necessary.  Regular physical exercise is one of the most important things you can do for your health. Most adults should get at least 150 minutes of moderate-intensity exercise (any activity that increases your heart rate and causes you to sweat) each week. In addition, most adults need muscle-strengthening exercises on 2 or more days a week.   Maintain a healthy weight. The body mass index (BMI) is a screening tool to identify possible weight problems. It provides an estimate of body fat based on height and weight. Your caregiver can help determine your BMI, and can help you achieve or maintain a healthy weight. For adults 20 years and older:  A BMI below 18.5 is considered underweight.  A BMI of 18.5 to 24.9 is normal.  A BMI of 25 to 29.9 is considered overweight.  A BMI of 30 and above is considered obese.  Maintain normal blood lipids and cholesterol by exercising and minimizing your intake of saturated fat. Eat a balanced diet with plenty of fruits and vegetables. Blood tests for lipids and cholesterol should begin at age 61 and be repeated every 5 years. If your lipid or cholesterol levels are high, you are over 50, or you are a high risk for heart  disease, you may need your cholesterol levels checked more frequently.Ongoing high lipid and cholesterol levels should be treated with medicines if diet and exercise are not effective.  If you smoke, find out from your caregiver how to quit. If you do not use tobacco, do not start.  Lung cancer screening is recommended for adults aged 33 80 years who are at high risk for developing lung cancer because of a history of smoking. Yearly low-dose computed tomography (CT) is recommended for people who have at least a 30-pack-year history of smoking and are a current smoker or have quit within the past 15 years. A pack year of smoking is smoking an average of 1 pack of cigarettes a day for 1 year (for example: 1 pack a day for 30 years or 2 packs a day for 15 years). Yearly screening should continue until the smoker has stopped smoking for at least 15 years. Yearly screening should also be stopped for people who develop a health problem that would prevent them from having lung cancer treatment.  If you are pregnant, do not drink alcohol. If you are breastfeeding, be very cautious about drinking alcohol. If you are not pregnant and choose to drink alcohol, do not exceed 1 drink per day. One drink is considered to be 12 ounces (355 mL) of beer, 5 ounces (148 mL) of wine, or 1.5 ounces (44 mL) of liquor.  Avoid use of street drugs. Do not share needles with anyone. Ask for help if you need support or instructions about stopping  the use of drugs.  High blood pressure causes heart disease and increases the risk of stroke. Blood pressure should be checked at least every 1 to 2 years. Ongoing high blood pressure should be treated with medicines, if weight loss and exercise are not effective.  If you are 59 to 34 years old, ask your caregiver if you should take aspirin to prevent strokes.  Diabetes screening involves taking a blood sample to check your fasting blood sugar level. This should be done once every 3  years, after age 91, if you are within normal weight and without risk factors for diabetes. Testing should be considered at a younger age or be carried out more frequently if you are overweight and have at least 1 risk factor for diabetes.  Breast cancer screening is essential preventative care for women. You should practice "breast self-awareness." This means understanding the normal appearance and feel of your breasts and may include breast self-examination. Any changes detected, no matter how small, should be reported to a caregiver. Women in their 66s and 30s should have a clinical breast exam (CBE) by a caregiver as part of a regular health exam every 1 to 3 years. After age 101, women should have a CBE every year. Starting at age 100, women should consider having a mammogram (breast X-ray) every year. Women who have a family history of breast cancer should talk to their caregiver about genetic screening. Women at a high risk of breast cancer should talk to their caregiver about having an MRI and a mammogram every year.  Breast cancer gene (BRCA)-related cancer risk assessment is recommended for women who have family members with BRCA-related cancers. BRCA-related cancers include breast, ovarian, tubal, and peritoneal cancers. Having family members with these cancers may be associated with an increased risk for harmful changes (mutations) in the breast cancer genes BRCA1 and BRCA2. Results of the assessment will determine the need for genetic counseling and BRCA1 and BRCA2 testing.  The Pap test is a screening test for cervical cancer. Women should have a Pap test starting at age 57. Between ages 25 and 35, Pap tests should be repeated every 2 years. Beginning at age 37, you should have a Pap test every 3 years as long as the past 3 Pap tests have been normal. If you had a hysterectomy for a problem that was not cancer or a condition that could lead to cancer, then you no longer need Pap tests. If you are  between ages 50 and 76, and you have had normal Pap tests going back 10 years, you no longer need Pap tests. If you have had past treatment for cervical cancer or a condition that could lead to cancer, you need Pap tests and screening for cancer for at least 20 years after your treatment. If Pap tests have been discontinued, risk factors (such as a new sexual partner) need to be reassessed to determine if screening should be resumed. Some women have medical problems that increase the chance of getting cervical cancer. In these cases, your caregiver may recommend more frequent screening and Pap tests.  The human papillomavirus (HPV) test is an additional test that may be used for cervical cancer screening. The HPV test looks for the virus that can cause the cell changes on the cervix. The cells collected during the Pap test can be tested for HPV. The HPV test could be used to screen women aged 44 years and older, and should be used in women of any age  who have unclear Pap test results. After the age of 55, women should have HPV testing at the same frequency as a Pap test.  Colorectal cancer can be detected and often prevented. Most routine colorectal cancer screening begins at the age of 44 and continues through age 20. However, your caregiver may recommend screening at an earlier age if you have risk factors for colon cancer. On a yearly basis, your caregiver may provide home test kits to check for hidden blood in the stool. Use of a small camera at the end of a tube, to directly examine the colon (sigmoidoscopy or colonoscopy), can detect the earliest forms of colorectal cancer. Talk to your caregiver about this at age 86, when routine screening begins. Direct examination of the colon should be repeated every 5 to 10 years through age 13, unless early forms of pre-cancerous polyps or small growths are found.  Hepatitis C blood testing is recommended for all people born from [birth year] through 1966 and any  individual with known risks for hepatitis C.  Practice safe sex. Use condoms and avoid high-risk sexual practices to reduce the spread of sexually transmitted infections (STIs). Sexually active women aged 36 and younger should be checked for Chlamydia, which is a common sexually transmitted infection. Older women with new or multiple partners should also be tested for Chlamydia. Testing for other STIs is recommended if you are sexually active and at increased risk.  Osteoporosis is a disease in which the bones lose minerals and strength with aging. This can result in serious bone fractures. The risk of osteoporosis can be identified using a bone density scan. Women ages 20 and over and women at risk for fractures or osteoporosis should discuss screening with their caregivers. Ask your caregiver whether you should be taking a calcium supplement or vitamin D to reduce the rate of osteoporosis.  Menopause can be associated with physical symptoms and risks. Hormone replacement therapy is available to decrease symptoms and risks. You should talk to your caregiver about whether hormone replacement therapy is right for you.  Use sunscreen. Apply sunscreen liberally and repeatedly throughout the day. You should seek shade when your shadow is shorter than you. Protect yourself by wearing long sleeves, pants, a wide-brimmed hat, and sunglasses year round, whenever you are outdoors.  Notify your caregiver of new moles or changes in moles, especially if there is a change in shape or color. Also notify your caregiver if a mole is larger than the size of a pencil eraser.  Stay current with your immunizations. Document Released: 12/10/2010 Document Revised: 09/21/2012 Document Reviewed: 12/10/2010 [facility] Patient Information 2014 [place].

## 2014-01-28 LAB — URINALYSIS W MICROSCOPIC + REFLEX CULTURE
Bilirubin Urine: NEGATIVE
CASTS: NONE SEEN
CRYSTALS: NONE SEEN
GLUCOSE, UA: NEGATIVE mg/dL
Hgb urine dipstick: NEGATIVE
Ketones, ur: NEGATIVE mg/dL
LEUKOCYTES UA: NEGATIVE
NITRITE: NEGATIVE
PH: 7 (ref 5.0–8.0)
Protein, ur: NEGATIVE mg/dL
SPECIFIC GRAVITY, URINE: 1.023 (ref 1.005–1.030)
Urobilinogen, UA: 0.2 mg/dL (ref 0.0–1.0)

## 2014-01-28 LAB — COMPREHENSIVE METABOLIC PANEL
ALK PHOS: 65 U/L (ref 39–117)
ALT: 13 U/L (ref 0–35)
AST: 16 U/L (ref 0–37)
Albumin: 4 g/dL (ref 3.5–5.2)
BILIRUBIN TOTAL: 0.3 mg/dL (ref 0.2–1.2)
BUN: 15 mg/dL (ref 6–23)
CO2: 29 meq/L (ref 19–32)
CREATININE: 1.02 mg/dL (ref 0.50–1.10)
Calcium: 9.6 mg/dL (ref 8.4–10.5)
Chloride: 102 mEq/L (ref 96–112)
Glucose, Bld: 88 mg/dL (ref 70–99)
Potassium: 4.5 mEq/L (ref 3.5–5.3)
Sodium: 138 mEq/L (ref 135–145)
TOTAL PROTEIN: 7.8 g/dL (ref 6.0–8.3)

## 2014-01-28 LAB — LIPID PANEL
CHOL/HDL RATIO: 2.4 ratio
CHOLESTEROL: 137 mg/dL (ref 0–200)
HDL: 58 mg/dL (ref >39)
LDL CALC: 68 mg/dL (ref 0–99)
Triglycerides: 56 mg/dL (ref <150)
VLDL: 11 mg/dL (ref 0–40)

## 2014-01-28 LAB — HEPATITIS B SURFACE ANTIGEN: Hepatitis B Surface Ag: NEGATIVE

## 2014-01-28 LAB — HEPATITIS C ANTIBODY: HCV Ab: REACTIVE — AB

## 2014-01-28 LAB — RPR

## 2014-01-28 LAB — GC/CHLAMYDIA PROBE AMP
CT PROBE, AMP APTIMA: NEGATIVE
GC Probe RNA: NEGATIVE

## 2014-01-28 LAB — HIV ANTIBODY (ROUTINE TESTING W REFLEX): HIV 1&2 Ab, 4th Generation: NONREACTIVE

## 2014-01-29 ENCOUNTER — Encounter: Payer: Self-pay | Admitting: Women's Health

## 2014-01-31 ENCOUNTER — Other Ambulatory Visit: Payer: Self-pay | Admitting: Gynecology

## 2014-01-31 ENCOUNTER — Other Ambulatory Visit: Payer: 59

## 2014-01-31 DIAGNOSIS — R768 Other specified abnormal immunological findings in serum: Secondary | ICD-10-CM

## 2014-01-31 NOTE — Telephone Encounter (Signed)
I saw that and ordered the confirmatory tests. Tell the patient this is a screening test and it may be a false positive (a lot of times they are) we are running a confirmatory test and we'll let her note as soon as we know. Check to make sure that the order is placed to the lab for this per my result note sent out Friday.

## 2014-02-01 LAB — HCV RNA, PCR, QUALITATIVE: Hepatitis C Vrs RNA by PCR-Qual: NEGATIVE

## 2014-02-02 ENCOUNTER — Other Ambulatory Visit: Payer: Self-pay | Admitting: Gynecology

## 2014-02-02 DIAGNOSIS — R768 Other specified abnormal immunological findings in serum: Secondary | ICD-10-CM

## 2014-02-16 ENCOUNTER — Encounter: Payer: Self-pay | Admitting: Emergency Medicine

## 2014-02-16 ENCOUNTER — Emergency Department (INDEPENDENT_AMBULATORY_CARE_PROVIDER_SITE_OTHER): Payer: 59

## 2014-02-16 ENCOUNTER — Emergency Department
Admission: EM | Admit: 2014-02-16 | Discharge: 2014-02-16 | Disposition: A | Discharge status: Home / Self Care | Payer: 59 | Source: Home / Self Care | Attending: Emergency Medicine | Admitting: Emergency Medicine

## 2014-02-16 DIAGNOSIS — R0789 Other chest pain: Secondary | ICD-10-CM

## 2014-02-16 DIAGNOSIS — R079 Chest pain, unspecified: Secondary | ICD-10-CM

## 2014-02-16 DIAGNOSIS — J208 Acute bronchitis due to other specified organisms: Secondary | ICD-10-CM

## 2014-02-16 MED ORDER — PREDNISONE 10 MG PO TABS: ORAL_TABLET | ORAL | Status: DC

## 2014-02-16 MED ORDER — AZITHROMYCIN 250 MG PO TABS: ORAL_TABLET | ORAL | Status: DC

## 2014-02-16 MED ORDER — HYDROCODONE-ACETAMINOPHEN 5-325 MG PO TABS: 2.0000 | ORAL_TABLET | ORAL | Status: DC | PRN

## 2014-02-16 NOTE — ED Notes (Signed)
Pt c/o center of her chest pain, SOB with some heaviness in her chest while laying down x 3 days. Denies nausea, jaw or arm pain.

## 2014-02-16 NOTE — ED Provider Notes (Signed)
CSN: [id number]     Arrival date & time 02/16/14  1758 History   First MD Initiated Contact with Patient 02/16/14 1837     Chief Complaint  Patient presents with  . Chest Pain  . Shortness of Breath   (Consider location/radiation/quality/duration/timing/severity/associated sxs/prior Treatment) Patient is a 33 y.o. female presenting with chest pain and shortness of breath. The history is provided by the patient. No language interpreter was used.  Chest Pain Pain location:  Unable to specify Pain quality: aching   Pain radiates to:  Does not radiate Pain radiates to the back: yes   Pain severity:  Moderate Onset quality:  Gradual Duration:  3 days Timing:  Constant Progression:  Worsening Chronicity:  New Context: breathing   Relieved by:  Nothing Worsened by:  Nothing tried Ineffective treatments:  None tried Associated symptoms: shortness of breath   Risk factors: no high cholesterol and no hypertension   Shortness of Breath Associated symptoms: chest pain   Pt reports she has had a cough for 3 weeks.  Pt reports she began having pain with breathing 3 days ago.  Pain  Continues,  Pain is with cough and deep breath.  Pt reated 3 weeks ago by ent for cough with no change in symptoms  Past Medical History  Diagnosis Date  . HSV infection   . Cervical dysplasia 2004    no treatment, normal paps after   Past Surgical History  Procedure Laterality Date  . Colposcopy     Family History  Problem Relation Age of Onset  . Hypertension Maternal Grandmother   . Heart disease Father    History  Substance Use Topics  . Smoking status: Never Smoker   . Smokeless tobacco: Not on file  . Alcohol Use: No     Comment: Rare   OB History   Grav Para Term Preterm Abortions TAB SAB Ect Mult Living   2    2     0     Review of Systems  Respiratory: Positive for shortness of breath.   Cardiovascular: Positive for chest pain.  All other systems reviewed and are  negative.   Allergies  Penicillins and Latex  Home Medications   Prior to Admission medications   Medication Sig Start Date End Date Taking? Authorizing Provider  azithromycin (ZITHROMAX) 250 MG tablet Two tablets 1st day then one tablet days 2-5 02/16/14   Elson Areas, PA-C  HYDROcodone-acetaminophen (NORCO/VICODIN) 5-325 MG per tablet Take 2 tablets by mouth every 4 (four) hours as needed. 02/16/14   Elson Areas, PA-C  predniSONE (DELTASONE) 10 MG tablet 6,5,4,3,2,1 taper 02/16/14   Elson Areas, PA-C  valACYclovir (VALTREX) 1000 MG tablet Take half tablet twice daily for 3-5 days 09/17/13   Harrington Challenger, NP   BP 130/86  Pulse 88  Temp(Src) 97.5 F (36.4 C) (Oral)  Resp 18  Ht  (1.676 m)  Wt 216 lb (97.977 kg)  BMI 34.88 kg/m2  SpO2 100%  LMP 02/12/2014 Physical Exam  Nursing note and vitals reviewed. Constitutional: She is oriented to person, place, and time. She appears well-developed and well-nourished.  HENT:  Head: Normocephalic and atraumatic.  Eyes: Conjunctivae and EOM are normal. Pupils are equal, round, and reactive to light.  Neck: Normal range of motion. Neck supple.  Cardiovascular: Normal rate and normal heart sounds.   Pulmonary/Chest: Effort normal.  Abdominal: Soft. She exhibits no distension.  Musculoskeletal: Normal range of motion.  Neurological: She is  alert and oriented to person, place, and time.  Skin: Skin is warm.  Psychiatric: She has a normal mood and affect.    ED Course  Procedures (including critical care time) Labs Review Labs Reviewed - No data to display  Imaging Review Dg Chest 2 View  02/16/2014   CLINICAL DATA:  Chest pain and difficulty breathing  EXAM: CHEST  2 VIEW  COMPARISON:  July 30, 2008  FINDINGS: The lungs are clear. The heart size and pulmonary vascularity are normal. No pneumothorax. No adenopathy. No bone lesions.  IMPRESSION: No abnormality noted.   Electronically Signed   By: Bretta Bang M.D.   On:  02/16/2014 18:57  EKG normal sinus, normal ekg early repol   MDM   1. Chest wall pain   2. Acute bronchitis due to other specified organisms    zpack Prednisone Hydrocodone Return if any problems.    Lonia Skinner Bridgman, PA-C 02/16/14 1929

## 2014-02-16 NOTE — Discharge Instructions (Signed)
Acute Bronchitis Bronchitis is inflammation of the airways that extend from the windpipe into the lungs (bronchi). The inflammation often causes mucus to develop. This leads to a cough, which is the most common symptom of bronchitis.  In acute bronchitis, the condition usually develops suddenly and goes away over time, usually in a couple weeks. Smoking, allergies, and asthma can make bronchitis worse. Repeated episodes of bronchitis may cause further lung problems.  CAUSES Acute bronchitis is most often caused by the same virus that causes a cold. The virus can spread from person to person (contagious) through coughing, sneezing, and touching contaminated objects. SIGNS AND SYMPTOMS   Cough.   Fever.   Coughing up mucus.   Body aches.   Chest congestion.   Chills.   Shortness of breath.   Sore throat.  DIAGNOSIS  Acute bronchitis is usually diagnosed through a physical exam. Your health care provider will also ask you questions about your medical history. Tests, such as chest X-rays, are sometimes done to rule out other conditions.  TREATMENT  Acute bronchitis usually goes away in a couple weeks. Oftentimes, no medical treatment is necessary. Medicines are sometimes given for relief of fever or cough. Antibiotic medicines are usually not needed but may be prescribed in certain situations. In some cases, an inhaler may be recommended to help reduce shortness of breath and control the cough. A cool mist vaporizer may also be used to help thin bronchial secretions and make it easier to clear the chest.  HOME CARE INSTRUCTIONS  Get plenty of rest.   Drink enough fluids to keep your urine clear or pale yellow (unless you have a medical condition that requires fluid restriction). Increasing fluids may help thin your respiratory secretions (sputum) and reduce chest congestion, and it will prevent dehydration.   Take medicines only as directed by your health care provider.  If  you were prescribed an antibiotic medicine, finish it all even if you start to feel better.  Avoid smoking and secondhand smoke. Exposure to cigarette smoke or irritating chemicals will make bronchitis worse. If you are a smoker, consider using nicotine gum or skin patches to help control withdrawal symptoms. Quitting smoking will help your lungs heal faster.   Reduce the chances of another bout of acute bronchitis by washing your hands frequently, avoiding people with cold symptoms, and trying not to touch your hands to your mouth, nose, or eyes.   Keep all follow-up visits as directed by your health care provider.  SEEK MEDICAL CARE IF: Your symptoms do not improve after 1 week of treatment.  SEEK IMMEDIATE MEDICAL CARE IF:  You develop an increased fever or chills.   You have chest pain.   You have severe shortness of breath.  You have bloody sputum.   You develop dehydration.  You faint or repeatedly feel like you are going to pass out.  You develop repeated vomiting.  You develop a severe headache. MAKE SURE YOU:   Understand these instructions.  Will watch your condition.  Will get help right away if you are not doing well or get worse. Document Released: 07/04/2004 Document Revised: 10/11/2013 Document Reviewed: 11/17/2012 [facility] Patient Information 2015 [place], [place]. This information is not intended to replace advice given to you by your health care provider. Make sure you discuss any questions you have with your health care provider. Chest Wall Pain Chest wall pain is pain in or around the bones and muscles of your chest. It may take up to 6  weeks to get better. It may take longer if you must stay physically active in your work and activities.  CAUSES  Chest wall pain may happen on its own. However, it may be caused by:  A viral illness like the flu.  Injury.  Coughing.  Exercise.  Arthritis.  Fibromyalgia.  Shingles. HOME CARE INSTRUCTIONS    Avoid overtiring physical activity. Try not to strain or perform activities that cause pain. This includes any activities using your chest or your abdominal and side muscles, especially if heavy weights are used.  Put ice on the sore area.  Put ice in a plastic bag.  Place a towel between your skin and the bag.  Leave the ice on for 15-20 minutes per hour while awake for the first 2 days.  Only take over-the-counter or prescription medicines for pain, discomfort, or fever as directed by your caregiver. SEEK IMMEDIATE MEDICAL CARE IF:   Your pain increases, or you are very uncomfortable.  You have a fever.  Your chest pain becomes worse.  You have new, unexplained symptoms.  You have nausea or vomiting.  You feel sweaty or lightheaded.  You have a cough with phlegm (sputum), or you cough up blood. MAKE SURE YOU:   Understand these instructions.  Will watch your condition.  Will get help right away if you are not doing well or get worse. Document Released: 05/27/2005 Document Revised: 08/19/2011 Document Reviewed: 01/21/2011 [facility] Patient Information 2015 [place], [place]. This information is not intended to replace advice given to you by your health care provider. Make sure you discuss any questions you have with your health care provider.

## 2014-02-18 ENCOUNTER — Other Ambulatory Visit: Payer: Self-pay

## 2014-02-18 MED ORDER — VALACYCLOVIR HCL 1 G PO TABS: ORAL_TABLET | ORAL | Status: DC

## 2014-02-18 NOTE — ED Provider Notes (Signed)
Medical history/examination/treatment/procedure(s) were performed by non-physician provider and as supervising physician I was immediately available for consultation/collaboration.  Lajean Manes, MD 02/18/14 [phone]

## 2014-03-25 ENCOUNTER — Other Ambulatory Visit: Payer: Self-pay

## 2014-04-05 ENCOUNTER — Encounter: Payer: Self-pay | Admitting: Gynecology

## 2014-04-05 ENCOUNTER — Ambulatory Visit (INDEPENDENT_AMBULATORY_CARE_PROVIDER_SITE_OTHER): Payer: 59 | Admitting: Gynecology

## 2014-04-05 DIAGNOSIS — B9689 Other specified bacterial agents as the cause of diseases classified elsewhere: Secondary | ICD-10-CM

## 2014-04-05 DIAGNOSIS — R300 Dysuria: Secondary | ICD-10-CM

## 2014-04-05 DIAGNOSIS — N760 Acute vaginitis: Secondary | ICD-10-CM

## 2014-04-05 DIAGNOSIS — N898 Other specified noninflammatory disorders of vagina: Secondary | ICD-10-CM

## 2014-04-05 DIAGNOSIS — A499 Bacterial infection, unspecified: Secondary | ICD-10-CM

## 2014-04-05 DIAGNOSIS — N3000 Acute cystitis without hematuria: Secondary | ICD-10-CM

## 2014-04-05 LAB — URINALYSIS W MICROSCOPIC + REFLEX CULTURE
Bilirubin Urine: NEGATIVE
Casts: NONE SEEN
Crystals: NONE SEEN
Glucose, UA: NEGATIVE mg/dL
HGB URINE DIPSTICK: NEGATIVE
Ketones, ur: NEGATIVE mg/dL
Nitrite: NEGATIVE
PROTEIN: NEGATIVE mg/dL
RBC / HPF: NONE SEEN RBC/hpf (ref <3)
Specific Gravity, Urine: 1.025 (ref 1.005–1.030)
UROBILINOGEN UA: 0.2 mg/dL (ref 0.0–1.0)
pH: 7 (ref 5.0–8.0)

## 2014-04-05 LAB — WET PREP FOR TRICH, YEAST, CLUE
Trich, Wet Prep: NONE SEEN
YEAST WET PREP: NONE SEEN

## 2014-04-05 MED ORDER — METRONIDAZOLE 500 MG PO TABS: 500.0000 mg | ORAL_TABLET | Freq: Two times a day (BID) | ORAL | Status: DC

## 2014-04-05 MED ORDER — SULFAMETHOXAZOLE-TRIMETHOPRIM 800-160 MG PO TABS: 1.0000 | ORAL_TABLET | Freq: Two times a day (BID) | ORAL | Status: DC

## 2014-04-05 NOTE — Progress Notes (Signed)
Teresa Harvey January 29, 1981 [id number]        33 y.o.  G2P0020 Presents with several days of discomfort with urination as a pulling sensation in her bladder. Also external vaginal irritation. No odor or significant discharge. No low back pain, fever/chills, nausea/vomiting.  Past medical history,surgical history, problem list, medications, allergies, family history and social history were all reviewed and documented in the EPIC chart.  Directed ROS with pertinent positives and negatives documented in the history of present illness/assessment and plan.  Exam: Kim assistant General appearance:  Normal Spine straight without CVA tenderness. Abdomen soft nontender without masses guarding rebound Pelvic external BUS vagina with white discharge. Cervix normal. Uterus normal size midline mobile nontender. Adnexa without masses or tenderness.  Assessment/Plan:  33 y.o. G2P0020 with above history. Urinalysis consistent with UTI. Wet prep consistent with bacterial vaginosis. Treat with Septra DS 1 by mouth twice a day 3 days and Flagyl 500 mg twice a day 7 days, alcohol avoidance reviewed. Follow up if symptoms persist, worsen or recur.     Dara LordsFONTAINE,Dewana Ammirati P MD, 10:45 AM 04/05/2014

## 2014-04-05 NOTE — Patient Instructions (Signed)
Take Septra antibiotic twice daily for 3 days. Take Flagyl medication twice daily for 7 days. Avoid alcohol while taking.  Follow up if symptoms persist, worsen or recur.

## 2014-04-07 LAB — URINE CULTURE
Colony Count: NO GROWTH
Organism ID, Bacteria: NO GROWTH

## 2014-04-11 ENCOUNTER — Encounter: Payer: Self-pay | Admitting: Gynecology

## 2014-04-15 ENCOUNTER — Ambulatory Visit (INDEPENDENT_AMBULATORY_CARE_PROVIDER_SITE_OTHER): Payer: 59 | Admitting: Gynecology

## 2014-04-15 ENCOUNTER — Encounter: Payer: Self-pay | Admitting: Gynecology

## 2014-04-15 DIAGNOSIS — N898 Other specified noninflammatory disorders of vagina: Secondary | ICD-10-CM

## 2014-04-15 DIAGNOSIS — Z113 Encounter for screening for infections with a predominantly sexual mode of transmission: Secondary | ICD-10-CM

## 2014-04-15 LAB — WET PREP FOR TRICH, YEAST, CLUE
Clue Cells Wet Prep HPF POC: NONE SEEN
Trich, Wet Prep: NONE SEEN

## 2014-04-15 NOTE — Progress Notes (Signed)
Teresa FuelBillie L Harvey [birth year] [id number]        33 y.o.  G2P0020 presents with several days of vaginal irritation. No real discharge or odor. On antibiotics for UTI. Also had a positive screen for hepatitis C but the PCR was negative. She requests patient be rescreened for STDs.  Past medical history,surgical history, problem list, medications, allergies, family history and social history were all reviewed and documented in the EPIC chart.  Directed ROS with pertinent positives and negatives documented in the history of present illness/assessment and plan.  Exam: Teresa Harvey assistant General appearance:  Normal Abdomen soft nontender without masses guarding rebound Pelvic external BUS vagina with white clumpy discharge. Cervix normal. Uterus normal size midline mobile nontender. Adnexa without masses or tenderness  Assessment/Plan:  33 y.o. G2P0020 with:  1. History, exam and wet prep consistent with yeast vaginitis. Treat with Diflucan 150 mg 1 dose.  Second pill provided to be used if symptoms persist. 2. STD screening. GC/Chlamydia, hepatitis B, hepatitis C, RPR, HIV done. If hepatitis C screen again positive then will follow up with the PCR testing.     Teresa Harvey,Teresa Harvey P MD, 2:40 PM 04/15/2014

## 2014-04-15 NOTE — Patient Instructions (Signed)
Take one Diflucan pill. If your symptoms persist take the second pill. Follow up if your symptoms persist, worsen or recur.

## 2014-04-16 LAB — GC/CHLAMYDIA PROBE AMP
CT Probe RNA: NEGATIVE
GC PROBE AMP APTIMA: NEGATIVE

## 2014-04-16 LAB — HEPATITIS B SURFACE ANTIGEN: HEP B S AG: NEGATIVE

## 2014-04-16 LAB — HIV ANTIBODY (ROUTINE TESTING W REFLEX): HIV: NONREACTIVE

## 2014-04-16 LAB — HEPATITIS C ANTIBODY: HCV Ab: REACTIVE — AB

## 2014-04-16 LAB — RPR

## 2014-04-18 ENCOUNTER — Other Ambulatory Visit: Payer: Self-pay | Admitting: Gynecology

## 2014-04-18 ENCOUNTER — Encounter: Payer: Self-pay | Admitting: Women's Health

## 2014-04-18 MED ORDER — FLUCONAZOLE 150 MG PO TABS: ORAL_TABLET | ORAL | Status: DC

## 2014-04-19 ENCOUNTER — Other Ambulatory Visit: Payer: 59

## 2014-04-19 ENCOUNTER — Encounter: Payer: Self-pay | Admitting: Women's Health

## 2014-04-19 ENCOUNTER — Other Ambulatory Visit: Payer: Self-pay | Admitting: Women's Health

## 2014-04-19 DIAGNOSIS — R768 Other specified abnormal immunological findings in serum: Secondary | ICD-10-CM

## 2014-04-20 ENCOUNTER — Other Ambulatory Visit: Payer: 59

## 2014-04-20 LAB — HCV RNA, PCR, QUALITATIVE: HEPATITIS C VRS RNA BY PCR-QUAL: NEGATIVE

## 2014-04-21 ENCOUNTER — Telehealth: Payer: Self-pay

## 2014-04-21 ENCOUNTER — Encounter: Payer: Self-pay | Admitting: Women's Health

## 2014-04-21 NOTE — Telephone Encounter (Signed)
Patient informed confimatory test negative.

## 2014-04-21 NOTE — Telephone Encounter (Signed)
Patient asked if you knew why she keeps testing pos for Hep C on screening and negative on confimatory test. She said she did that back in August and had to do the second test. Then she said we had her return now and it is pos again and she had to repeat.

## 2014-04-21 NOTE — Telephone Encounter (Signed)
-----   Message from Dara Lordsimothy P Fontaine, MD sent at 04/21/2014  4:43 PM EST ----- Tell patient the confirmatory hepatitis C follow up test was negative.

## 2014-04-21 NOTE — Telephone Encounter (Signed)
Teresa Harvey, It does look like her result is ready. Please advise what to tell her.

## 2014-04-22 NOTE — Telephone Encounter (Signed)
Patient informed. 

## 2014-04-22 NOTE — Telephone Encounter (Signed)
Patient advised. Patient asked her if her positive HSV could be causing this?

## 2014-04-22 NOTE — Telephone Encounter (Signed)
The first test is a more nonspecific tests were other things in the blood or other antibodies can cross react with what we are testing to give you what we call a false positive. This can happen sometimes with syphilis testing and other antibody type testing.

## 2014-04-22 NOTE — Telephone Encounter (Signed)
no

## 2014-05-12 ENCOUNTER — Other Ambulatory Visit: Payer: Self-pay | Admitting: Gynecology

## 2014-05-12 DIAGNOSIS — R768 Other specified abnormal immunological findings in serum: Secondary | ICD-10-CM

## 2014-05-16 ENCOUNTER — Other Ambulatory Visit: Payer: 59

## 2014-05-16 DIAGNOSIS — R768 Other specified abnormal immunological findings in serum: Secondary | ICD-10-CM

## 2014-05-17 LAB — ANA: Anti Nuclear Antibody(ANA): NEGATIVE

## 2014-05-17 LAB — IGM: IGM, SERUM: 233 mg/dL (ref 52–322)

## 2014-05-17 LAB — IGG: IgG (Immunoglobin G), Serum: 1790 mg/dL — ABNORMAL HIGH (ref 690–1700)

## 2014-05-17 LAB — ANTI-DNA ANTIBODY, DOUBLE-STRANDED: DS DNA AB: 1 [IU]/mL

## 2014-05-18 LAB — MITOCHONDRIAL ANTIBODIES: Mitochondrial M2 Ab, IgG: 0.28 (ref <0.91)

## 2014-05-18 LAB — ANTI-SMOOTH MUSCLE ANTIBODY, IGG: Smooth Muscle Ab: 7 U (ref <20)

## 2014-05-19 ENCOUNTER — Encounter: Payer: Self-pay | Admitting: Gynecology

## 2014-05-19 ENCOUNTER — Other Ambulatory Visit: Payer: Self-pay | Admitting: Gynecology

## 2014-05-19 DIAGNOSIS — R760 Raised antibody titer: Secondary | ICD-10-CM

## 2014-07-10 ENCOUNTER — Emergency Department
Admission: EM | Admit: 2014-07-10 | Discharge: 2014-07-10 | Disposition: A | Discharge status: Home / Self Care | Payer: 59 | Source: Home / Self Care | Attending: Family Medicine | Admitting: Family Medicine

## 2014-07-10 DIAGNOSIS — R1011 Right upper quadrant pain: Secondary | ICD-10-CM

## 2014-07-10 LAB — POCT CBC W AUTO DIFF (K'VILLE URGENT CARE)

## 2014-07-10 LAB — POCT URINALYSIS DIP (MANUAL ENTRY)
Bilirubin, UA: NEGATIVE
Glucose, UA: NEGATIVE
Ketones, POC UA: NEGATIVE
Leukocytes, UA: NEGATIVE
Nitrite, UA: NEGATIVE
Protein Ur, POC: NEGATIVE
Spec Grav, UA: 1.025
Urobilinogen, UA: 0.2
pH, UA: 6

## 2014-07-10 LAB — COMPLETE METABOLIC PANEL WITH GFR
ALBUMIN: 3.9 g/dL (ref 3.5–5.2)
ALT: 12 U/L (ref 0–35)
AST: 15 U/L (ref 0–37)
Alkaline Phosphatase: 57 U/L (ref 39–117)
BILIRUBIN TOTAL: 0.4 mg/dL (ref 0.2–1.2)
BUN: 15 mg/dL (ref 6–23)
CO2: 28 meq/L (ref 19–32)
Calcium: 9.7 mg/dL (ref 8.4–10.5)
Chloride: 103 mEq/L (ref 96–112)
Creat: 0.84 mg/dL (ref 0.50–1.10)
GFR, Est African American: >89 mL/min
Glucose, Bld: 77 mg/dL (ref 70–99)
Potassium: 4.5 mEq/L (ref 3.5–5.3)
Sodium: 139 mEq/L (ref 135–145)
Total Protein: 7.5 g/dL (ref 6.0–8.3)

## 2014-07-10 LAB — LIPASE: Lipase: 39 U/L (ref 0–75)

## 2014-07-10 LAB — AMYLASE: AMYLASE: 51 U/L (ref 0–105)

## 2014-07-10 MED ORDER — ESOMEPRAZOLE MAGNESIUM 40 MG PO PACK: PACK | ORAL | Status: DC

## 2014-07-10 NOTE — Discharge Instructions (Signed)
Avoid fried and fatty foods.   Biliary Colic  Biliary colic is a steady or irregular pain in the upper abdomen. It is usually under the right side of the rib cage. It happens when gallstones interfere with the normal flow of bile from the gallbladder. Bile is a liquid that helps to digest fats. Bile is made in the liver and stored in the gallbladder. When you eat a meal, bile passes from the gallbladder through the cystic duct and the common bile duct into the small intestine. There, it mixes with partially digested food. If a gallstone blocks either of these ducts, the normal flow of bile is blocked. The muscle cells in the bile duct contract forcefully to try to move the stone. This causes the pain of biliary colic.  SYMPTOMS   A person with biliary colic usually complains of pain in the upper abdomen. This pain can be:  In the center of the upper abdomen just below the breastbone.  In the upper-right part of the abdomen, near the gallbladder and liver.  Spread back toward the right shoulder blade.  Nausea and vomiting.  The pain usually occurs after eating.  Biliary colic is usually triggered by the digestive system's demand for bile. The demand for bile is high after fatty meals. Symptoms can also occur when a person who has been fasting suddenly eats a very large meal. Most episodes of biliary colic pass after 1 to 5 hours. After the most intense pain passes, your abdomen may continue to ache mildly for about 24 hours. DIAGNOSIS  After you describe your symptoms, your caregiver will perform a physical exam. He or she will pay attention to the upper right portion of your belly (abdomen). This is the area of your liver and gallbladder. An ultrasound will help your caregiver look for gallstones. Specialized scans of the gallbladder may also be done. Blood tests may be done, especially if you have fever or if your pain persists. PREVENTION  Biliary colic can be prevented by controlling the  risk factors for gallstones. Some of these risk factors, such as heredity, increasing age, and pregnancy are a normal part of life. Obesity and a high-fat diet are risk factors you can change through a healthy lifestyle. Women going through menopause who take hormone replacement therapy (estrogen) are also more likely to develop biliary colic. TREATMENT   Pain medication may be prescribed.  You may be encouraged to eat a fat-free diet.  If the first episode of biliary colic is severe, or episodes of colic keep retuning, surgery to remove the gallbladder (cholecystectomy) is usually recommended. This procedure can be done through small incisions using an instrument called a laparoscope. The procedure often requires a brief stay in the hospital. Some people can leave the hospital the same day. It is the most widely used treatment in people troubled by painful gallstones. It is effective and safe, with no complications in more than 90% of cases.  If surgery cannot be done, medication that dissolves gallstones may be used. This medication is expensive and can take months or years to work. Only small stones will dissolve.  Rarely, medication to dissolve gallstones is combined with a procedure called shock-wave lithotripsy. This procedure uses carefully aimed shock waves to break up gallstones. In many people treated with this procedure, gallstones form again within a few years. PROGNOSIS  If gallstones block your cystic duct or common bile duct, you are at risk for repeated episodes of biliary colic. There is also  a 25% chance that you will develop a gallbladder infection(acute cholecystitis), or some other complication of gallstones within 10 to 20 years. If you have surgery, schedule it at a time that is convenient for you and at a time when you are not sick. HOME CARE INSTRUCTIONS   Drink plenty of clear fluids.  Avoid fatty, greasy or fried foods, or any foods that make your pain worse.  Take  medications as directed. SEEK MEDICAL CARE IF:   You develop a fever over 100.5 F (38.1 C).  Your pain gets worse over time.  You develop nausea that prevents you from eating and drinking.  You develop vomiting. SEEK IMMEDIATE MEDICAL CARE IF:   You have continuous or severe belly (abdominal) pain which is not relieved with medications.  You develop nausea and vomiting which is not relieved with medications.  You have symptoms of biliary colic and you suddenly develop a fever and shaking chills. This may signal cholecystitis. Call your caregiver immediately.  You develop a yellow color to your skin or the white part of your eyes (jaundice). Document Released: 10/28/2005 Document Revised: 08/19/2011 Document Reviewed: 01/07/2008 [facility] Patient Information 2015 [place], [place]. This information is not intended to replace advice given to you by your health care provider. Make sure you discuss any questions you have with your health care provider.

## 2014-07-10 NOTE — ED Notes (Signed)
Patient c/o left side flank pain x 3 weeks, describes pain today as constant and dull, no nausea.

## 2014-07-10 NOTE — ED Provider Notes (Signed)
CSN: [id number]     Arrival date & time 07/10/14  1142 History   First MD Initiated Contact with Patient 07/10/14 1152     Chief Complaint  Patient presents with  . Flank Pain    left     HPI Comments: Patient complains of 3 week history of intermittently recurring upper abdominal pain described as stabbing.  It tends to occur about 1 hour after meals and resolves about 2 hours.  The pain is worse after spicy foods and coffee.  The pain is improved somewhat after taking Tums antacid.  She also has nausea without vomiting.  The pain does not radiate.  No fevers, chills, and sweats.   She has been mildly constipated but otherwise her bowel movements have been normal.  Patient's last menstrual period was 05/24/2014 (approximate).  No history of abdominal surgery.  No family history of GI illness  Patient is a 34 y.o. female presenting with abdominal pain. The history is provided by the patient.  Abdominal Pain Pain location:  Epigastric Pain quality: dull and stabbing   Pain radiates to:  Does not radiate Pain severity:  Mild Onset quality:  Gradual Duration:  3 weeks Timing:  Intermittent Progression:  Worsening Chronicity:  New Context: eating   Relieved by: Tums. Worsened by:  Eating Ineffective treatments:  Position changes Associated symptoms: constipation and nausea   Associated symptoms: no anorexia, no belching, no chest pain, no chills, no cough, no diarrhea, no dysuria, no fatigue, no fever, no hematemesis, no hematochezia, no hematuria, no melena, no shortness of breath, no sore throat, no vaginal discharge and no vomiting     Past Medical History  Diagnosis Date  . HSV infection   . Cervical dysplasia 2004    no treatment, normal paps after   Past Surgical History  Procedure Laterality Date  . Colposcopy     Family History  Problem Relation Age of Onset  . Hypertension Maternal Grandmother   . Heart disease Father    History  Substance Use Topics  . Smoking  status: Never Smoker   . Smokeless tobacco: Not on file  . Alcohol Use: No     Comment: Rare   OB History    Gravida Para Term Preterm AB TAB SAB Ectopic Multiple Living   2    2     0     Review of Systems  Constitutional: Negative for fever, chills and fatigue.  HENT: Negative for sore throat.   Respiratory: Negative for cough and shortness of breath.   Cardiovascular: Negative for chest pain.  Gastrointestinal: Positive for nausea, abdominal pain and constipation. Negative for vomiting, diarrhea, melena, hematochezia, anorexia and hematemesis.  Genitourinary: Negative for dysuria, hematuria and vaginal discharge.  All other systems reviewed and are negative.   Allergies  Penicillins and Latex  Home Medications   Prior to Admission medications   Medication Sig Start Date End Date Taking? Authorizing Provider  esomeprazole (NEXIUM) 40 MG packet Take one by mouth 30 minutes before evening meal 07/10/14   Lattie Haw, MD   BP 120/80 mmHg  Pulse 88  Temp(Src) 98 F (36.7 C) (Oral)  Ht  (1.676 m)  Wt 214 lb (97.07 kg)  BMI 34.56 kg/m2  SpO2 99%  LMP 05/24/2014 (Approximate) Physical Exam Nursing notes and Vital Signs reviewed. Appearance:  Patient appears stated age, and in no acute distress.  Patient is obese (BMI 34.6) Eyes:  Pupils are equal, round, and reactive to light and  accomodation.  Extraocular movement is intact.  Conjunctivae are not inflamed  Nose:  Normal  Pharynx:  Normal; moist mucous membranes  Neck:  Supple.  No adenopathy Lungs:  Clear to auscultation.  Breath sounds are equal.  Heart:  Regular rate and rhythm without murmurs, rubs, or gallops.  Abdomen:   Tenderness right upper quadrant without masses or hepatosplenomegaly.  Bowel sounds are present.  No CVA or flank tenderness.  Murphy's sign present. Extremities:  No edema.  No calf tenderness Skin:  No rash present.   ED Course  Procedures  None    Labs Reviewed  COMPLETE METABOLIC  PANEL WITH GFR  AMYLASE  LIPASE  POCT CBC W AUTO DIFF ([facility]):  WBC 6.9; LY 36.9; MO 3.9; GR 59.2; Hgb 14.3; Platelets 284  POCT urinalysis:  BLO trace; otherwise negative        MDM   1. Abdominal pain, right upper quadrant; ?biliary colic; ?GERD    CMP, amylase, lipase pending.  Avoid fried and fatty foods. Begin trial of Nexium 40mg  each evening ac Schedule gall bladder ultrasound Followup with Family Doctor in five days.   Lattie HawStephen A Beese, MD 07/10/14 [phone]

## 2014-07-13 ENCOUNTER — Ambulatory Visit (INDEPENDENT_AMBULATORY_CARE_PROVIDER_SITE_OTHER): Payer: 59

## 2014-07-13 ENCOUNTER — Encounter: Payer: Self-pay | Admitting: Family Medicine

## 2014-07-13 DIAGNOSIS — R109 Unspecified abdominal pain: Secondary | ICD-10-CM

## 2014-07-14 ENCOUNTER — Ambulatory Visit: Payer: 59 | Admitting: Family Medicine

## 2014-07-15 ENCOUNTER — Encounter: Payer: Self-pay | Admitting: Women's Health

## 2014-09-09 ENCOUNTER — Ambulatory Visit (INDEPENDENT_AMBULATORY_CARE_PROVIDER_SITE_OTHER): Payer: 59 | Admitting: Family

## 2014-09-09 ENCOUNTER — Encounter: Payer: Self-pay | Admitting: Family

## 2014-09-09 ENCOUNTER — Other Ambulatory Visit (INDEPENDENT_AMBULATORY_CARE_PROVIDER_SITE_OTHER): Payer: 59

## 2014-09-09 VITALS — BP 122/80 | HR 87 | Temp 97.7°F | Ht 66.0 in | Wt 217.0 lb

## 2014-09-09 DIAGNOSIS — R21 Rash and other nonspecific skin eruption: Secondary | ICD-10-CM | POA: Diagnosis not present

## 2014-09-09 DIAGNOSIS — R1013 Epigastric pain: Secondary | ICD-10-CM

## 2014-09-09 DIAGNOSIS — J302 Other seasonal allergic rhinitis: Secondary | ICD-10-CM | POA: Insufficient documentation

## 2014-09-09 LAB — AMYLASE: AMYLASE: 62 U/L (ref 27–131)

## 2014-09-09 LAB — H. PYLORI ANTIBODY, IGG: H Pylori IgG: NEGATIVE

## 2014-09-09 LAB — LIPASE: Lipase: 51 U/L (ref 11.0–59.0)

## 2014-09-09 MED ORDER — TRIAMCINOLONE ACETONIDE 0.1 % EX CREA: 1.0000 "application " | TOPICAL_CREAM | Freq: Two times a day (BID) | CUTANEOUS | Status: DC

## 2014-09-09 MED ORDER — OMEPRAZOLE 20 MG PO CPDR: 20.0000 mg | DELAYED_RELEASE_CAPSULE | Freq: Every day | ORAL | Status: DC

## 2014-09-09 NOTE — Assessment & Plan Note (Signed)
Previous ultrasound revealed no gallstones. Patient continues to experience epigastric pain related to food. Obtain amylase, lipase, and H. pylori. Start omeprazole. If blood work is negative, and no relief his experience with the omeprazole, we'll consider referral to GI or CT scan.

## 2014-09-09 NOTE — Progress Notes (Signed)
Subjective:    Patient ID: Teresa Harvey, female    DOB: March 15, 1981, 34 y.o.   MRN: [id number]  Chief Complaint  Patient presents with  . Establish Care    abdominal pain    HPI:  Teresa Harvey is a 34 y.o. female who presents today to establish care and discuss abdominal pain.  1) Abdominal pain - This is a new problem. Associated symptom of pain located in her epigastric region but can be spread across her upper abdomen has been going on for about 2 months. Pain is described as sharp with an intensity of 6/7-10. Contextually worsened with acidic foods, but occurs with meals. Notes that she has had increased gas and reflux lately.  Had an ultrasound an [facility] and had an ultrasound completed which was normal. She was prescribed omeprazole which helped a little bit.   2) Allergies - patient with significant history of allergies. Indicates she has tried Zyrtec, Claritin, and Allegra with minimal relief. Although not currently experiencing any symptoms, would like to know if there is any other allergy medications that are available for her to try.  3) Rash on back - associated symptom of rash located on her back between her scapulas has been going on for several months. Indicates she was previously seen at a clinic and was told it was eczema. She was given unknown cream which helps some, however it came back.   Allergies  Allergen Reactions  . Penicillins     REACTION: tongue swealling  . Latex Itching and Rash    Redness and itching with latex exposure    No current outpatient prescriptions on file prior to visit.   No current facility-administered medications on file prior to visit.    Past Medical History  Diagnosis Date  . HSV infection   . Cervical dysplasia 2004    no treatment, normal paps after    Past Surgical History  Procedure Laterality Date  . Colposcopy      Family History  Problem Relation Age of Onset  . Hypertension Maternal  Grandmother   . Heart disease Father   . Healthy Mother     History   Social History  . Marital Status: Single    Spouse Name: N/A  . Number of Children: N/A  . Years of Education: N/A   Occupational History  . Not on file.   Social History Main Topics  . Smoking status: Never Smoker   . Smokeless tobacco: Never Used  . Alcohol Use: No     Comment: Rare  . Drug Use: No  . Sexual Activity: Yes    Birth Control/ Protection: Condom   Other Topics Concern  . Not on file   Social History Narrative     Review of Systems  Constitutional: Negative for fever and chills.  Respiratory: Negative for cough.   Gastrointestinal: Positive for abdominal distention (after eat). Negative for nausea, vomiting, diarrhea and constipation.  Skin: Positive for rash.      Objective:    BP 122/80 mmHg  Pulse 87  Temp(Src) 97.7 F (36.5 C) (Oral)  Ht  (1.676 m)  Wt 217 lb (98.431 kg)  BMI 35.04 kg/m2  SpO2 97%  LMP 08/16/2014 Nursing note and vital signs reviewed.  Physical Exam  Constitutional: She is oriented to person, place, and time. She appears well-developed and well-nourished. No distress.  Cardiovascular: Normal rate, regular rhythm, normal heart sounds and intact distal pulses.   Pulmonary/Chest: Effort  normal and breath sounds normal.  Abdominal: Normal appearance and bowel sounds are normal. There is tenderness in the epigastric area. There is no rigidity, no rebound, no guarding, no tenderness at McBurney's point and negative Murphy's sign.  Neurological: She is alert and oriented to person, place, and time.  Skin: Skin is warm and dry.  Red, raised papules with slightly red base located between her scapulas.   Psychiatric: She has a normal mood and affect. Her behavior is normal. Judgment and thought content normal.       Assessment & Plan:

## 2014-09-09 NOTE — Assessment & Plan Note (Signed)
Symptoms, patient consistent with allergic rhinitis. Discussed potential treatment options including continuing with a second-generation antihistamine combined with Flonase. If this does not work consider starting montelukast for seasonal allergies. Follow-up pending trial of antihistamine and nasal corticosteroid.

## 2014-09-09 NOTE — Patient Instructions (Addendum)
Thank you for choosing [facility].  Summary/Instructions:   Add flonase to your allergy medication.   For your back - steroid cream + Aveeno, Dove or Eucerin.   Your prescription(s) have been submitted to your pharmacy or been printed and provided for you. Please take as directed and contact our office if you believe you are having problem(s) with the medication(s) or have any questions.  If your symptoms worsen or fail to improve, please contact our office for further instruction, or in case of emergency go directly to the emergency room at the closest medical facility.    Peptic Ulcer A peptic ulcer is a sore in the lining of your esophagus (esophageal ulcer), stomach (gastric ulcer), or in the first part of your small intestine (duodenal ulcer). The ulcer causes erosion into the deeper tissue. CAUSES  Normally, the lining of the stomach and the small intestine protects itself from the acid that digests food. The protective lining can be damaged by:  An infection caused by a bacterium called Helicobacter pylori (H. pylori).  Regular use of nonsteroidal anti-inflammatory drugs (NSAIDs), such as ibuprofen or aspirin.  Smoking tobacco. Other risk factors include being older than 50, drinking alcohol excessively, and having a family history of ulcer disease.  SYMPTOMS   Burning pain or gnawing in the area between the chest and the belly button.  Heartburn.  Nausea and vomiting.  Bloating. The pain can be worse on an empty stomach and at night. If the ulcer results in bleeding, it can cause:  Black, tarry stools.  Vomiting of bright red blood.  Vomiting of coffee-ground-looking materials. DIAGNOSIS  A diagnosis is usually made based upon your history and an exam. Other tests and procedures may be performed to find the cause of the ulcer. Finding a cause will help determine the best treatment. Tests and procedures may include:  Blood tests, stool tests, or breath tests  to check for the bacterium H. pylori.  An upper gastrointestinal (GI) series of the esophagus, stomach, and small intestine.  An endoscopy to examine the esophagus, stomach, and small intestine.  A biopsy. TREATMENT  Treatment may include:  Eliminating the cause of the ulcer, such as smoking, NSAIDs, or alcohol.  Medicines to reduce the amount of acid in your digestive tract.  Antibiotic medicines if the ulcer is caused by the H. pylori bacterium.  An upper endoscopy to treat a bleeding ulcer.  Surgery if the bleeding is severe or if the ulcer created a hole somewhere in the digestive system. HOME CARE INSTRUCTIONS   Avoid tobacco, alcohol, and caffeine. Smoking can increase the acid in the stomach, and continued smoking will impair the healing of ulcers.  Avoid foods and drinks that seem to cause discomfort or aggravate your ulcer.  Only take medicines as directed by your caregiver. Do not substitute over-the-counter medicines for prescription medicines without talking to your caregiver.  Keep any follow-up appointments and tests as directed. SEEK MEDICAL CARE IF:   Your do not improve within 7 days of starting treatment.  You have ongoing indigestion or heartburn. SEEK IMMEDIATE MEDICAL CARE IF:   You have sudden, sharp, or persistent abdominal pain.  You have bloody or dark black, tarry stools.  You vomit blood or vomit that looks like coffee grounds.  You become light-headed, weak, or feel faint.  You become sweaty or clammy. MAKE SURE YOU:   Understand these instructions.  Will watch your condition.  Will get help right away if you are not  doing well or get worse. Document Released: 05/24/2000 Document Revised: 10/11/2013 Document Reviewed: 12/25/2011 [facility] Patient Information 2015 [place]. This information is not intended to replace advice given to you by your health care provider. Make sure you discuss any questions you have with your health care  provider.  Gastroesophageal Reflux Disease, Adult Gastroesophageal reflux disease (GERD) happens when acid from your stomach flows up into the esophagus. When acid comes in contact with the esophagus, the acid causes soreness (inflammation) in the esophagus. Over time, GERD may create small holes (ulcers) in the lining of the esophagus. CAUSES   Increased body weight. This puts pressure on the stomach, making acid rise from the stomach into the esophagus.  Smoking. This increases acid production in the stomach.  Drinking alcohol. This causes decreased pressure in the lower esophageal sphincter (valve or ring of muscle between the esophagus and stomach), allowing acid from the stomach into the esophagus.  Late evening meals and a full stomach. This increases pressure and acid production in the stomach.  A malformed lower esophageal sphincter. Sometimes, no cause is found. SYMPTOMS   Burning pain in the lower part of the mid-chest behind the breastbone and in the mid-stomach area. This may occur twice a week or more often.  Trouble swallowing.  Sore throat.  Dry cough.  Asthma-like symptoms including chest tightness, shortness of breath, or wheezing. DIAGNOSIS  Your caregiver may be able to diagnose GERD based on your symptoms. In some cases, X-rays and other tests may be done to check for complications or to check the condition of your stomach and esophagus. TREATMENT  Your caregiver may recommend over-the-counter or prescription medicines to help decrease acid production. Ask your caregiver before starting or adding any new medicines.  HOME CARE INSTRUCTIONS   Change the factors that you can control. Ask your caregiver for guidance concerning weight loss, quitting smoking, and alcohol consumption.  Avoid foods and drinks that make your symptoms worse, such as:  Caffeine or alcoholic drinks.  Chocolate.  Peppermint or mint flavorings.  Garlic and onions.  Spicy  foods.  Citrus fruits, such as oranges, lemons, or limes.  Tomato-based foods such as sauce, chili, salsa, and pizza.  Fried and fatty foods.  Avoid lying down for the 3 hours prior to your bedtime or prior to taking a nap.  Eat small, frequent meals instead of large meals.  Wear loose-fitting clothing. Do not wear anything tight around your waist that causes pressure on your stomach.  Raise the head of your bed 6 to 8 inches with wood blocks to help you sleep. Extra pillows will not help.  Only take over-the-counter or prescription medicines for pain, discomfort, or fever as directed by your caregiver.  Do not take aspirin, ibuprofen, or other nonsteroidal anti-inflammatory drugs (NSAIDs). SEEK IMMEDIATE MEDICAL CARE IF:   You have pain in your arms, neck, jaw, teeth, or back.  Your pain increases or changes in intensity or duration.  You develop nausea, vomiting, or sweating (diaphoresis).  You develop shortness of breath, or you faint.  Your vomit is green, yellow, black, or looks like coffee grounds or blood.  Your stool is red, bloody, or black. These symptoms could be signs of other problems, such as heart disease, gastric bleeding, or esophageal bleeding. MAKE SURE YOU:   Understand these instructions.  Will watch your condition.  Will get help right away if you are not doing well or get worse. Document Released: 03/06/2005 Document Revised: 08/19/2011 Document Reviewed: 12/14/2010  [employer] Patient Information 2015 [employer]. This information is not intended to replace advice given to you by your health care provider. Make sure you discuss any questions you have with your health care provider.  

## 2014-09-09 NOTE — Progress Notes (Signed)
Pre visit review using our clinic review tool, if applicable. No additional management support is needed unless otherwise documented below in the visit note. 

## 2014-09-09 NOTE — Assessment & Plan Note (Signed)
Symptoms and exam consistent with eczema rash. Start triamcinolone cream. Continue to keep the area moisturized. Recommend Aveeno, Dove, or Eucerin products. Follow-up if symptoms worsen or fail to improve.

## 2014-09-21 ENCOUNTER — Encounter: Payer: Self-pay | Admitting: Gynecology

## 2014-09-21 ENCOUNTER — Ambulatory Visit (INDEPENDENT_AMBULATORY_CARE_PROVIDER_SITE_OTHER): Payer: 59 | Admitting: Gynecology

## 2014-09-21 VITALS — BP 120/76

## 2014-09-21 DIAGNOSIS — L298 Other pruritus: Secondary | ICD-10-CM

## 2014-09-21 DIAGNOSIS — B373 Candidiasis of vulva and vagina: Secondary | ICD-10-CM | POA: Diagnosis not present

## 2014-09-21 DIAGNOSIS — N898 Other specified noninflammatory disorders of vagina: Secondary | ICD-10-CM

## 2014-09-21 DIAGNOSIS — B3731 Acute candidiasis of vulva and vagina: Secondary | ICD-10-CM

## 2014-09-21 LAB — WET PREP FOR TRICH, YEAST, CLUE
Clue Cells Wet Prep HPF POC: NONE SEEN
Trich, Wet Prep: NONE SEEN

## 2014-09-21 MED ORDER — FLUCONAZOLE 150 MG PO TABS: 150.0000 mg | ORAL_TABLET | Freq: Once | ORAL | Status: DC

## 2014-09-21 NOTE — Progress Notes (Signed)
Teresa Harvey 09-Sep-1980 [id number]        34 y.o.  G2P0020 Presents with several days of vaginal itching/irritation and discharge. No odor. No urinary symptoms such as frequency dysuria urgency or low back pain.  Past medical history,surgical history, problem list, medications, allergies, family history and social history were all reviewed and documented in the EPIC chart.  Directed ROS with pertinent positives and negatives documented in the history of present illness/assessment and plan.  Exam: Kim assistant Filed Vitals:   09/21/14 1008  BP: 120/76   General appearance:  Normal Abdomen soft nontender without masses guarding rebound Pelvic external BUS vagina with thick cottage cheese discharge. Cervix grossly normal. Uterus normal size midline mobile nontender. Adnexa without masses or tenderness.  Assessment/Plan:  34 y.o. G2P0020 with history, exam and wet prep consistent with yeast vaginitis. Diflucan 150 mg pill every other day 2 doses. Follow up if symptoms persist, worsen or recur.    Dara LordsFONTAINE,Graceson Nichelson P MD, 10:43 AM 09/21/2014

## 2014-09-21 NOTE — Patient Instructions (Signed)
Take the one Diflucan pill today and then repeat it 2 days from now.

## 2014-10-01 ENCOUNTER — Encounter: Payer: Self-pay | Admitting: Gynecology

## 2014-10-03 ENCOUNTER — Other Ambulatory Visit: Payer: Self-pay | Admitting: Gynecology

## 2014-10-03 MED ORDER — TERCONAZOLE 0.8 % VA CREA: 1.0000 | TOPICAL_CREAM | Freq: Every day | VAGINAL | Status: DC

## 2014-10-03 NOTE — Telephone Encounter (Signed)
Terazol 3 day cream. One applicator nightly 3 days

## 2014-11-10 ENCOUNTER — Encounter: Payer: Self-pay | Admitting: Family

## 2014-11-11 ENCOUNTER — Other Ambulatory Visit: Payer: 59

## 2014-11-11 MED ORDER — OMEPRAZOLE 20 MG PO CPDR: 20.0000 mg | DELAYED_RELEASE_CAPSULE | Freq: Every day | ORAL | Status: DC

## 2014-11-14 ENCOUNTER — Encounter: Payer: Self-pay | Admitting: Gastroenterology

## 2014-11-14 ENCOUNTER — Encounter: Payer: Self-pay | Admitting: Family

## 2014-11-14 ENCOUNTER — Other Ambulatory Visit: Payer: 59

## 2014-11-14 DIAGNOSIS — R1013 Epigastric pain: Secondary | ICD-10-CM

## 2014-11-16 ENCOUNTER — Other Ambulatory Visit: Payer: 59

## 2014-11-16 ENCOUNTER — Other Ambulatory Visit: Payer: Self-pay | Admitting: Gynecology

## 2014-11-16 ENCOUNTER — Ambulatory Visit: Payer: 59 | Admitting: Family

## 2014-11-16 DIAGNOSIS — R760 Raised antibody titer: Secondary | ICD-10-CM

## 2014-11-16 DIAGNOSIS — R768 Other specified abnormal immunological findings in serum: Secondary | ICD-10-CM

## 2014-11-17 LAB — IGG: IGG (IMMUNOGLOBIN G), SERUM: 1940 mg/dL — AB (ref 690–1700)

## 2014-11-17 LAB — HCV RNA, PCR, QUALITATIVE: Hepatitis C Vrs RNA by PCR-Qual: NEGATIVE

## 2014-12-20 ENCOUNTER — Ambulatory Visit (INDEPENDENT_AMBULATORY_CARE_PROVIDER_SITE_OTHER): Payer: 59 | Admitting: Gastroenterology

## 2014-12-20 ENCOUNTER — Encounter: Payer: Self-pay | Admitting: Gastroenterology

## 2014-12-20 VITALS — BP 110/80 | HR 64 | Ht 66.0 in | Wt 218.4 lb

## 2014-12-20 DIAGNOSIS — R1013 Epigastric pain: Secondary | ICD-10-CM | POA: Diagnosis not present

## 2014-12-20 DIAGNOSIS — K219 Gastro-esophageal reflux disease without esophagitis: Secondary | ICD-10-CM

## 2014-12-20 MED ORDER — OMEPRAZOLE 40 MG PO CPDR: 40.0000 mg | DELAYED_RELEASE_CAPSULE | Freq: Every day | ORAL | Status: DC

## 2014-12-20 NOTE — Patient Instructions (Signed)
Increase strength omeprazole (to  once daily). You will be set up for an upper endoscopy.

## 2014-12-20 NOTE — Progress Notes (Signed)
HPI: This is a   very pleasant 34 year old woman   who was referred to me by Veryl Speakalone, Gregory D, FNP  to evaluate  pyrosis, hepatitis C positive antibody    Chief complaint is pyrosis  She has been getting for hep c testing about annually.  Blood work 2016 hepatitis C antibody positive however PCR RNA testing has been negative. Complete metabolic profile has been normal. Hepatitis B testing and HIV testing is all been negative checked 2 or 3 times in the past 2 years. Abdominal ultrasound 3 2016 was normal  Upper abdominal pains; went [facility]; labs done, US done (see above). Those upper abd pains stopped and no she is bothered by GERD like symptoms.  She stopped coffee, tomatoes.  Pyrosis up to her throat.  Feels like has trapped belch.  After dietary changes her symptoms improved somewhat but not completely.    She started PPI (prilosec 20mg  daily, 30 min before BF).  Helps  But not much at all.    Pyrosis is worse after eating.  Not worse with laying.  Has pressure on chest.  Has gained 30-40 pounds in past year.  No NSAIDs.  Review of systems: Pertinent positive and negative review of systems were noted in the above HPI section. Complete review of systems was performed and was otherwise normal.   Past Medical History  Diagnosis Date  . HSV infection   . Cervical dysplasia 2004    no treatment, normal paps after    Past Surgical History  Procedure Laterality Date  . Colposcopy      Current Outpatient Prescriptions  Medication Sig Dispense Refill  . Multiple Vitamins-Minerals (MULTIVITAMIN & MINERAL PO) Take by mouth.    Marland Kitchen. omeprazole (PRILOSEC) 20 MG capsule Take 1 capsule (20 mg total) by mouth daily. 90 capsule 1  . valACYclovir (VALTREX) 1000 MG tablet Take 1,000 mg by mouth daily. 1 tab by mouth daily     No current facility-administered medications for this visit.    Allergies as of 12/20/2014 - Review Complete 12/20/2014  Allergen Reaction Noted   . Penicillins    . Latex Itching and Rash 12/30/2012    Family History  Problem Relation Age of Onset  . Hypertension Maternal Grandmother   . Heart disease Father   . Healthy Mother     History   Social History  . Marital Status: Single    Spouse Name: N/A  . Number of Children: 0  . Years of Education: 16   Occupational History  . Lab    Social History Main Topics  . Smoking status: Never Smoker   . Smokeless tobacco: Never Used  . Alcohol Use: No     Comment: Rare  . Drug Use: No  . Sexual Activity: Yes    Birth Control/ Protection: Condom   Other Topics Concern  . 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[employer])     Physical Exam: BP 110/80 mmHg  Pulse 64  Ht 5\' 6"  (1.676 m)  Wt 218 lb 6.4 oz (99.066 kg)  BMI 35.27 kg/m2  LMP 12/17/2014 Constitutional: generally well-appearing Psychiatric: alert and oriented x3 Eyes: extraocular movements intact Mouth: oral pharynx moist, no lesions Neck: supple no lymphadenopathy Cardiovascular: heart regular rate and rhythm Lungs: clear to auscultation bilaterally Abdomen: soft, nontender, nondistended, no obvious ascites, no peritoneal signs, normal bowel sounds Extremities: no lower extremity edema bilaterally Skin: no lesions on visible extremities  Assessment and plan: 34 y.o. female with  GERD, hepatitis C antibody positive  I think that her hepatitis C antibody positivity was a false negative since her PCR RNA testing has been negative on multiple occasions. I do not think she needs any further hepatitis C testing. Her liver ultrasound was normal as well. Her liver tests, transaminases are all normal. She does have pyrosis and that was really the reason for her visit today. 20 mg omeprazole once daily has not completely helped her symptoms and show she will increase to 40 mg. We will also proceed with EGD at her soonest convenience check for hiatal hernia, significant GERD  damage, peptic ulcer disease.   Rob Bunting, MD [facility] 12/20/2014, 2:04 PM  Cc: Veryl Speak, FNP

## 2015-01-03 ENCOUNTER — Encounter: Payer: Self-pay | Admitting: Gastroenterology

## 2015-01-03 ENCOUNTER — Ambulatory Visit (AMBULATORY_SURGERY_CENTER): Payer: 59 | Admitting: Gastroenterology

## 2015-01-03 VITALS — BP 120/78 | HR 77 | Temp 97.5°F | Resp 18 | Ht 66.0 in | Wt 218.0 lb

## 2015-01-03 DIAGNOSIS — R1013 Epigastric pain: Secondary | ICD-10-CM

## 2015-01-03 DIAGNOSIS — K2970 Gastritis, unspecified, without bleeding: Secondary | ICD-10-CM

## 2015-01-03 DIAGNOSIS — K2950 Unspecified chronic gastritis without bleeding: Secondary | ICD-10-CM | POA: Diagnosis not present

## 2015-01-03 MED ORDER — SODIUM CHLORIDE 0.9 % IV SOLN: 500.0000 mL | INTRAVENOUS | Status: DC

## 2015-01-03 NOTE — Progress Notes (Signed)
Called to room to assist during endoscopic procedure.  Patient ID and intended procedure confirmed with present staff. Received instructions for my participation in the procedure from the performing physician.  

## 2015-01-03 NOTE — Op Note (Signed)
[facility] [address] [place], [address]   ENDOSCOPY PROCEDURE REPORT  PATIENT: Teresa, Harvey  MR#: [id number] BIRTHDATE: 02/26/1981 , 33  yrs. old GENDER: female ENDOSCOPIST: Rachael Fee, MD PROCEDURE DATE:  01/03/2015 PROCEDURE:  EGD w/ biopsy ASA CLASS:     Class I INDICATIONS:  gerd. MEDICATIONS: Monitored anesthesia care and Propofol 180 mg IV TOPICAL ANESTHETIC: none  DESCRIPTION OF PROCEDURE: After the risks benefits and alternatives of the procedure were thoroughly explained, informed consent was obtained.  The LB [id number] endoscope was introduced through the mouth and advanced to the second portion of the duodenum , Without limitations.  The instrument was slowly withdrawn as the mucosa was fully examined.  There was mild, non-specific distal gastritis.  This was biopsied and sent to pathology.  The examination was otherwise normal. Retroflexed views revealed no abnormalities.     The scope was then withdrawn from the patient and the procedure completed. COMPLICATIONS: There were no immediate complications.  ENDOSCOPIC IMPRESSION: There was mild, non-specific distal gastritis.  This was biopsied and sent to pathology.  The examination was otherwise normal  RECOMMENDATIONS: If the biopsies show H.  pylori, you will be started on appropriate antibitoics.  For now, please change the way your are taking your omeprazole so that you are taking  20-30 minutes prior to your breakfast meal.  eSigned:  Rachael Fee, MD 01/03/2015 10:01 AM

## 2015-01-03 NOTE — Progress Notes (Signed)
A/ox3 pleased with MAC, report to tracy RN

## 2015-01-03 NOTE — Patient Instructions (Signed)
Impressions/recommendations:  Gastritis (handout given)  YOU HAD AN ENDOSCOPIC PROCEDURE TODAY AT THE [facility]:   Refer to the procedure report that was given to you for any specific questions about what was found during the examination.  If the procedure report does not answer your questions, please call your gastroenterologist to clarify.  If you requested that your care partner not be given the details of your procedure findings, then the procedure report has been included in a sealed envelope for you to review at your convenience later.  YOU SHOULD EXPECT: Some feelings of bloating in the abdomen. Passage of more gas than usual.  Walking can help get rid of the air that was put into your GI tract during the procedure and reduce the bloating. If you had a lower endoscopy (such as a colonoscopy or flexible sigmoidoscopy) you may notice spotting of blood in your stool or on the toilet paper. If you underwent a bowel prep for your procedure, you may not have a normal bowel movement for a few days.  Please Note:  You might notice some irritation and congestion in your nose or some drainage.  This is from the oxygen used during your procedure.  There is no need for concern and it should clear up in a day or so.  SYMPTOMS TO REPORT IMMEDIATELY:    Following upper endoscopy (EGD)  Vomiting of blood or coffee ground material  New chest pain or pain under the shoulder blades  Painful or persistently difficult swallowing  New shortness of breath  Fever of 100F or higher  Black, tarry-looking stools  For urgent or emergent issues, a gastroenterologist can be reached at any hour by calling [phone].   DIET: Your first meal following the procedure should be a small meal and then it is ok to progress to your normal diet. Heavy or fried foods are harder to digest and may make you feel nauseous or bloated.  Likewise, meals heavy in dairy and vegetables can increase bloating.  Drink  plenty of fluids but you should avoid alcoholic beverages for 24 hours.  ACTIVITY:  You should plan to take it easy for the rest of today and you should NOT DRIVE or use heavy machinery until tomorrow (because of the sedation medicines used during the test).    FOLLOW UP: Our staff will call the number listed on your records the next business day following your procedure to check on you and address any questions or concerns that you may have regarding the information given to you following your procedure. If we do not reach you, we will leave a message.  However, if you are feeling well and you are not experiencing any problems, there is no need to return our call.  We will assume that you have returned to your regular daily activities without incident.  If any biopsies were taken you will be contacted by phone or by letter within the next 1-3 weeks.  Please call us at [phone] if you have not heard about the biopsies in 3 weeks.    SIGNATURES/CONFIDENTIALITY: You and/or your care partner have signed paperwork which will be entered into your electronic medical record.  These signatures attest to the fact that that the information above on your After Visit Summary has been reviewed and is understood.  Full responsibility of the confidentiality of this discharge information lies with you and/or your care-partner.

## 2015-01-04 ENCOUNTER — Telehealth: Payer: Self-pay | Admitting: *Deleted

## 2015-01-04 NOTE — Telephone Encounter (Signed)
  Follow up Call-  Call back number 01/03/2015  Post procedure Call Back phone  # [phone]  Permission to leave phone message Yes    [facility]

## 2015-01-10 ENCOUNTER — Encounter: Payer: Self-pay | Admitting: Gastroenterology

## 2015-02-28 ENCOUNTER — Encounter: Payer: Self-pay | Admitting: Gynecology

## 2015-02-28 ENCOUNTER — Ambulatory Visit (INDEPENDENT_AMBULATORY_CARE_PROVIDER_SITE_OTHER): Payer: 59 | Admitting: Gynecology

## 2015-02-28 VITALS — BP 118/76 | Ht 67.0 in | Wt 214.0 lb

## 2015-02-28 DIAGNOSIS — Z01419 Encounter for gynecological examination (general) (routine) without abnormal findings: Secondary | ICD-10-CM | POA: Diagnosis not present

## 2015-02-28 NOTE — Patient Instructions (Signed)
Returned for your blood work between days 21 and 25 of your menstrual cycle. Start on a prenatal vitamin every day Stop the Valtrex for now. You may obtain a copy of any labs that were done today by logging onto MyChart as outlined in the instructions provided with your AVS (after visit summary). The office will not call with normal lab results but certainly if there are any significant abnormalities then we will contact you.   Health Maintenance Adopting a healthy lifestyle and getting preventive care can go a long way to promote health and wellness. Talk with your health care provider about what schedule of regular examinations is right for you. This is a good chance for you to check in with your provider about disease prevention and staying healthy. In between checkups, there are plenty of things you can do on your own. Experts have done a lot of research about which lifestyle changes and preventive measures are most likely to keep you healthy. Ask your health care provider for more information. WEIGHT AND DIET  Eat a healthy diet  Be sure to include plenty of vegetables, fruits, low-fat dairy products, and lean protein.  Do not eat a lot of foods high in solid fats, added sugars, or salt.  Get regular exercise. This is one of the most important things you can do for your health.  Most adults should exercise for at least 150 minutes each week. The exercise should increase your heart rate and make you sweat (moderate-intensity exercise).  Most adults should also do strengthening exercises at least twice a week. This is in addition to the moderate-intensity exercise.  Maintain a healthy weight  Body mass index (BMI) is a measurement that can be used to identify possible weight problems. It estimates body fat based on height and weight. Your health care provider can help determine your BMI and help you achieve or maintain a healthy weight.  For females 69 years of age and older:   A BMI  below 18.5 is considered underweight.  A BMI of 18.5 to 24.9 is normal.  A BMI of 25 to 29.9 is considered overweight.  A BMI of 30 and above is considered obese.  Watch levels of cholesterol and blood lipids  You should start having your blood tested for lipids and cholesterol at 35 years of age, then have this test every 5 years.  You may need to have your cholesterol levels checked more often if:  Your lipid or cholesterol levels are high.  You are older than 35 years of age.  You are at high risk for heart disease.  CANCER SCREENING   Lung Cancer  Lung cancer screening is recommended for adults 27-36 years old who are at high risk for lung cancer because of a history of smoking.  A yearly low-dose CT scan of the lungs is recommended for people who:  Currently smoke.  Have quit within the past 15 years.  Have at least a 30-pack-year history of smoking. A pack year is smoking an average of one pack of cigarettes a day for 1 year.  Yearly screening should continue until it has been 15 years since you quit.  Yearly screening should stop if you develop a health problem that would prevent you from having lung cancer treatment.  Breast Cancer  Practice breast self-awareness. This means understanding how your breasts normally appear and feel.  It also means doing regular breast self-exams. Let your health care provider know about any changes, no matter  how small.  If you are in your 20s or 30s, you should have a clinical breast exam (CBE) by a health care provider every 1-3 years as part of a regular health exam.  If you are 15 or older, have a CBE every year. Also consider having a breast X-ray (mammogram) every year.  If you have a family history of breast cancer, talk to your health care provider about genetic screening.  If you are at high risk for breast cancer, talk to your health care provider about having an MRI and a mammogram every year.  Breast cancer gene  (BRCA) assessment is recommended for women who have family members with BRCA-related cancers. BRCA-related cancers include:  Breast.  Ovarian.  Tubal.  Peritoneal cancers.  Results of the assessment will determine the need for genetic counseling and BRCA1 and BRCA2 testing. Cervical Cancer Routine pelvic examinations to screen for cervical cancer are no longer recommended for nonpregnant women who are considered low risk for cancer of the pelvic organs (ovaries, uterus, and vagina) and who do not have symptoms. A pelvic examination may be necessary if you have symptoms including those associated with pelvic infections. Ask your health care provider if a screening pelvic exam is right for you.   The Pap test is the screening test for cervical cancer for women who are considered at risk.  If you had a hysterectomy for a problem that was not cancer or a condition that could lead to cancer, then you no longer need Pap tests.  If you are older than 65 years, and you have had normal Pap tests for the past 10 years, you no longer need to have Pap tests.  If you have had past treatment for cervical cancer or a condition that could lead to cancer, you need Pap tests and screening for cancer for at least 20 years after your treatment.  If you no longer get a Pap test, assess your risk factors if they change (such as having a new sexual partner). This can affect whether you should start being screened again.  Some women have medical problems that increase their chance of getting cervical cancer. If this is the case for you, your health care provider may recommend more frequent screening and Pap tests.  The human papillomavirus (HPV) test is another test that may be used for cervical cancer screening. The HPV test looks for the virus that can cause cell changes in the cervix. The cells collected during the Pap test can be tested for HPV.  The HPV test can be used to screen women 68 years of age and  older. Getting tested for HPV can extend the interval between normal Pap tests from three to five years.  An HPV test also should be used to screen women of any age who have unclear Pap test results.  After 35 years of age, women should have HPV testing as often as Pap tests.  Colorectal Cancer  This type of cancer can be detected and often prevented.  Routine colorectal cancer screening usually begins at 35 years of age and continues through 35 years of age.  Your health care provider may recommend screening at an earlier age if you have risk factors for colon cancer.  Your health care provider may also recommend using home test kits to check for hidden blood in the stool.  A small camera at the end of a tube can be used to examine your colon directly (sigmoidoscopy or colonoscopy). This is done  to check for the earliest forms of colorectal cancer.  Routine screening usually begins at age 50.  Direct examination of the colon should be repeated every 5-10 years through 35 years of age. However, you may need to be screened more often if early forms of precancerous polyps or small growths are found. Skin Cancer  Check your skin from head to toe regularly.  Tell your health care provider about any new moles or changes in moles, especially if there is a change in a mole's shape or color.  Also tell your health care provider if you have a mole that is larger than the size of a pencil eraser.  Always use sunscreen. Apply sunscreen liberally and repeatedly throughout the day.  Protect yourself by wearing long sleeves, pants, a wide-brimmed hat, and sunglasses whenever you are outside. HEART DISEASE, DIABETES, AND HIGH BLOOD PRESSURE   Have your blood pressure checked at least every 1-2 years. High blood pressure causes heart disease and increases the risk of stroke.  If you are between 55 years and 79 years old, ask your health care provider if you should take aspirin to prevent  strokes.  Have regular diabetes screenings. This involves taking a blood sample to check your fasting blood sugar level.  If you are at a normal weight and have a low risk for diabetes, have this test once every three years after 35 years of age.  If you are overweight and have a high risk for diabetes, consider being tested at a younger age or more often. PREVENTING INFECTION  Hepatitis B  If you have a higher risk for hepatitis B, you should be screened for this virus. You are considered at high risk for hepatitis B if:  You were born in a country where hepatitis B is common. Ask your health care provider which countries are considered high risk.  Your parents were born in a high-risk country, and you have not been immunized against hepatitis B (hepatitis B vaccine).  You have HIV or AIDS.  You use needles to inject street drugs.  You live with someone who has hepatitis B.  You have had sex with someone who has hepatitis B.  You get hemodialysis treatment.  You take certain medicines for conditions, including cancer, organ transplantation, and autoimmune conditions. Hepatitis C  Blood testing is recommended for:  Everyone born from 1945 through 1965.  Anyone with known risk factors for hepatitis C. Sexually transmitted infections (STIs)  You should be screened for sexually transmitted infections (STIs) including gonorrhea and chlamydia if:  You are sexually active and are younger than 35 years of age.  You are older than 35 years of age and your health care provider tells you that you are at risk for this type of infection.  Your sexual activity has changed since you were last screened and you are at an increased risk for chlamydia or gonorrhea. Ask your health care provider if you are at risk.  If you do not have HIV, but are at risk, it may be recommended that you take a prescription medicine daily to prevent HIV infection. This is called pre-exposure prophylaxis  (PrEP). You are considered at risk if:  You are sexually active and do not regularly use condoms or know the HIV status of your partner(s).  You take drugs by injection.  You are sexually active with a partner who has HIV. Talk with your health care provider about whether you are at high risk of being infected with   HIV. If you choose to begin PrEP, you should first be tested for HIV. You should then be tested every 3 months for as long as you are taking PrEP.  PREGNANCY   If you are premenopausal and you may become pregnant, ask your health care provider about preconception counseling.  If you may become pregnant, take 400 to 800 micrograms (mcg) of folic acid every day.  If you want to prevent pregnancy, talk to your health care provider about birth control (contraception). OSTEOPOROSIS AND MENOPAUSE   Osteoporosis is a disease in which the bones lose minerals and strength with aging. This can result in serious bone fractures. Your risk for osteoporosis can be identified using a bone density scan.  If you are 65 years of age or older, or if you are at risk for osteoporosis and fractures, ask your health care provider if you should be screened.  Ask your health care provider whether you should take a calcium or vitamin D supplement to lower your risk for osteoporosis.  Menopause may have certain physical symptoms and risks.  Hormone replacement therapy may reduce some of these symptoms and risks. Talk to your health care provider about whether hormone replacement therapy is right for you.  HOME CARE INSTRUCTIONS   Schedule regular health, dental, and eye exams.  Stay current with your immunizations.   Do not use any tobacco products including cigarettes, chewing tobacco, or electronic cigarettes.  If you are pregnant, do not drink alcohol.  If you are breastfeeding, limit how much and how often you drink alcohol.  Limit alcohol intake to no more than 1 drink per day for  nonpregnant women. One drink equals 12 ounces of beer, 5 ounces of wine, or 1 ounces of hard liquor.  Do not use street drugs.  Do not share needles.  Ask your health care provider for help if you need support or information about quitting drugs.  Tell your health care provider if you often feel depressed.  Tell your health care provider if you have ever been abused or do not feel safe at home. Document Released: 12/10/2010 Document Revised: 10/11/2013 Document Reviewed: 04/28/2013 Exit[employer] Patient Information 2015 [employer]. This information is not intended to replace advice given to you by your health care provider. Make sure you discuss any questions you have with your health care provider.  

## 2015-02-28 NOTE — Progress Notes (Signed)
Teresa Harvey 07-11-1980 [id number]        34 y.o.  G2P0020 for annual exam.  Doing well. Is attempting pregnancy at this time.  Past medical history,surgical history, problem list, medications, allergies, family history and social history were all reviewed and documented as reviewed in the EPIC chart.  ROS:  Performed with pertinent positives and negatives included in the history, assessment and plan.   Additional significant findings :  none   Exam: [facility] person Vitals:   02/28/15 1441  BP: 118/76  Height:  (1.702 m)  Weight: 214 lb (97.07 kg)   General appearance:  Normal affect, orientation and appearance. Skin: Grossly normal HEENT: Without gross lesions.  No cervical or supraclavicular adenopathy. Thyroid normal.  Lungs:  Clear without wheezing, rales or rhonchi Cardiac: RR, without RMG Abdominal:  Soft, nontender, without masses, guarding, rebound, organomegaly or hernia Breasts:  Examined lying and sitting without masses, retractions, discharge or axillary adenopathy. Pelvic:  Ext/BUS/vagina normal  Cervix normal  Uterus anteverted, normal size, shape and contour, midline and mobile nontender   Adnexa  Without masses or tenderness    Anus and perineum  Normal   Rectovaginal  Normal sphincter tone without palpated masses or tenderness.    Assessment/Plan:  34 y.o. G67P0020 female for annual exam with regular menses, no contraception.   1. Attempting pregnancy over the past 4 months. Having monthly menses approximately every 30 days. No bleeding in between. Recommended starting on prenatal vitamin now re-conceptually. I reviewed timing issues and when intercourse would be most efficient. Recommended to stop Valtrex for now and allow her to get through the first trimester before considering reinitiation, this to be discussed with her obstetrician at that time. Return for day 21 through 25 progesterone level. If not pregnant within the next 6 months follow up  for further evaluation to include tubal/female factor and hormone level evaluation. 2. Pap smear/HPV 2014 negative. No Pap smear done today.  History of dysplasia 2004 with no treatment and normal Pap smears afterwards. Plan repeat Pap smear at 3-5 year interval. 3. Breast health. We'll plan mammography closer to 39. SBE monthly reviewed. 4. Health maintenance. We will draw baseline CBC comprehensive metabolic panel lipid profile when she has her progesterone done. Follow up in 6 months if without pregnancy. Patient does know that we do not deliver and once she achieves pregnancy she will need to establish ongoing care with obstetrical group in [place].   Teresa Lords MD, 3:13 PM 02/28/2015

## 2015-03-01 LAB — URINALYSIS W MICROSCOPIC + REFLEX CULTURE
Bacteria, UA: NONE SEEN [HPF]
Bilirubin Urine: NEGATIVE
Casts: NONE SEEN [LPF]
Crystals: NONE SEEN [HPF]
GLUCOSE, UA: NEGATIVE
Hgb urine dipstick: NEGATIVE
Ketones, ur: NEGATIVE
LEUKOCYTES UA: NEGATIVE
NITRITE: NEGATIVE
Protein, ur: NEGATIVE
Specific Gravity, Urine: 1.019 (ref 1.001–1.035)
Yeast: NONE SEEN [HPF]
pH: 5.5 (ref 5.0–8.0)

## 2015-03-02 LAB — URINE CULTURE
COLONY COUNT: NO GROWTH
ORGANISM ID, BACTERIA: NO GROWTH

## 2015-03-08 ENCOUNTER — Other Ambulatory Visit: Payer: Self-pay | Admitting: Gynecology

## 2015-03-08 ENCOUNTER — Encounter: Payer: Self-pay | Admitting: Gynecology

## 2015-03-08 NOTE — Telephone Encounter (Signed)
Okay to refill 1 year 

## 2015-03-15 ENCOUNTER — Other Ambulatory Visit: Payer: 59

## 2015-03-17 ENCOUNTER — Other Ambulatory Visit: Payer: 59

## 2015-03-17 DIAGNOSIS — Z01419 Encounter for gynecological examination (general) (routine) without abnormal findings: Secondary | ICD-10-CM

## 2015-03-17 LAB — COMPREHENSIVE METABOLIC PANEL
ALT: 12 U/L (ref 6–29)
AST: 14 U/L (ref 10–30)
Albumin: 4 g/dL (ref 3.6–5.1)
Alkaline Phosphatase: 62 U/L (ref 33–115)
BUN: 14 mg/dL (ref 7–25)
CHLORIDE: 104 mmol/L (ref 98–110)
CO2: 25 mmol/L (ref 20–31)
CREATININE: 0.78 mg/dL (ref 0.50–1.10)
Calcium: 9.1 mg/dL (ref 8.6–10.2)
GLUCOSE: 86 mg/dL (ref 65–99)
POTASSIUM: 4.4 mmol/L (ref 3.5–5.3)
SODIUM: 138 mmol/L (ref 135–146)
TOTAL PROTEIN: 7.3 g/dL (ref 6.1–8.1)
Total Bilirubin: 0.3 mg/dL (ref 0.2–1.2)

## 2015-03-17 LAB — LIPID PANEL
CHOL/HDL RATIO: 2.8 ratio (ref <=5.0)
CHOLESTEROL: 126 mg/dL (ref 125–200)
HDL: 45 mg/dL — ABNORMAL LOW (ref >=46)
LDL Cholesterol: 75 mg/dL (ref <130)
Triglycerides: 29 mg/dL (ref <150)
VLDL: 6 mg/dL (ref <30)

## 2015-03-17 LAB — CBC WITH DIFFERENTIAL/PLATELET
BASOS ABS: 0.1 10*3/uL (ref 0.0–0.1)
BASOS PCT: 1 % (ref 0–1)
EOS ABS: 0.1 10*3/uL (ref 0.0–0.7)
EOS PCT: 1 % (ref 0–5)
HCT: 39.3 % (ref 36.0–46.0)
Hemoglobin: 13.5 g/dL (ref 12.0–15.0)
Lymphocytes Relative: 28 % (ref 12–46)
Lymphs Abs: 1.5 10*3/uL (ref 0.7–4.0)
MCH: 30.3 pg (ref 26.0–34.0)
MCHC: 34.4 g/dL (ref 30.0–36.0)
MCV: 88.3 fL (ref 78.0–100.0)
MPV: 9.9 fL (ref 8.6–12.4)
Monocytes Absolute: 0.5 10*3/uL (ref 0.1–1.0)
Monocytes Relative: 9 % (ref 3–12)
NEUTROS PCT: 61 % (ref 43–77)
Neutro Abs: 3.3 10*3/uL (ref 1.7–7.7)
PLATELETS: 282 10*3/uL (ref 150–400)
RBC: 4.45 MIL/uL (ref 3.87–5.11)
RDW: 13.6 % (ref 11.5–15.5)
WBC: 5.4 10*3/uL (ref 4.0–10.5)

## 2015-03-18 LAB — PROGESTERONE: PROGESTERONE: 6.6 ng/mL

## 2015-03-24 ENCOUNTER — Encounter: Payer: Self-pay | Admitting: Gynecology

## 2015-03-24 NOTE — Telephone Encounter (Signed)
6.6 is an ovulatory range which means it looks like she did ovulate and produce the egg. This is a good thing

## 2015-07-11 MED FILL — valACYclovir HCL 1 GM TABS: 1 | 30 days supply | Qty: 30 | Fill #4

## 2015-07-12 MED FILL — CLINDAMYCIN HCL 300 MG CAPS: 300 | 10 days supply | Qty: 40 | Fill #0

## 2015-07-12 MED FILL — IBUPROFEN 600 MG TABLET: 600 | 7 days supply | Qty: 30 | Fill #0

## 2015-07-12 MED FILL — HYDROCODON-APAP 5-325: 5-325 | 5 days supply | Qty: 20 | Fill #0

## 2015-07-12 MED FILL — CHLORHEXIDINE 0.12% RINSE: 0.12 | 16 days supply | Qty: 473 | Fill #0

## 2015-08-07 ENCOUNTER — Encounter: Payer: Self-pay | Admitting: Gynecology

## 2015-08-07 NOTE — Telephone Encounter (Signed)
I do not think antibiotics will delay her menses. Sometimes stressful situations can. If she does not start by next week then recommend office visit for evaluation

## 2015-08-11 MED FILL — valACYclovir HCL 1 GM TABS: 1 | 30 days supply | Qty: 30 | Fill #5

## 2015-08-16 ENCOUNTER — Encounter: Payer: Self-pay | Admitting: Gynecology

## 2015-08-16 NOTE — Telephone Encounter (Signed)
She had a progesterone level of 6.6 in October which is clearly in an ovulatory range then I do not think Clomid would help.  2 options would be to start with semen analysis, [facility] to check for tubal patency. If she does this then a Vibramycin 100 mg night before, a.m. of procedure and twice a day 5 days. Alternative would be appointment with Dr. April MansonYalcinkaya reproductive endocrinologist first whom I would refer to regardless for infertility.

## 2015-08-18 ENCOUNTER — Telehealth: Payer: Self-pay | Admitting: *Deleted

## 2015-08-18 NOTE — Telephone Encounter (Signed)
Referral and office notes faxed to Dr.Y office they will contact pt to schedule

## 2015-08-18 NOTE — Telephone Encounter (Signed)
-----   Message from Keenan BachelorKatherine R Annas, [place]MA sent at 08/17/2015  9:07 AM EST ----- Regarding: referral to Dr. April MansonYalcinkaya Patient does want referral to Dr. Jeannie FendY.  Was option offered by Dr. Velvet BatheF in advice request note from 08/16/15. Thanks!!!!

## 2015-08-29 ENCOUNTER — Encounter: Payer: Self-pay | Admitting: Gynecology

## 2015-09-15 MED FILL — valACYclovir HCL 1 GM TABS: 1 | 30 days supply | Qty: 30 | Fill #6

## 2015-10-02 NOTE — Telephone Encounter (Signed)
Dr.Y office has tried several times to contact pt to schedule and no response.

## 2015-10-16 DIAGNOSIS — Z319 Encounter for procreative management, unspecified: Secondary | ICD-10-CM | POA: Diagnosis not present

## 2015-10-16 DIAGNOSIS — Z130 Encounter for screening for diseases of the blood and blood-forming organs and certain disorders involving the immune mechanism: Secondary | ICD-10-CM | POA: Diagnosis not present

## 2015-10-16 DIAGNOSIS — N971 Female infertility of tubal origin: Secondary | ICD-10-CM | POA: Diagnosis not present

## 2015-10-16 DIAGNOSIS — E288 Other ovarian dysfunction: Secondary | ICD-10-CM | POA: Diagnosis not present

## 2015-10-16 DIAGNOSIS — Z3161 Procreative counseling and advice using natural family planning: Secondary | ICD-10-CM | POA: Diagnosis not present

## 2015-10-16 DIAGNOSIS — Z31430 Encounter of female for testing for genetic disease carrier status for procreative management: Secondary | ICD-10-CM | POA: Diagnosis not present

## 2015-10-16 DIAGNOSIS — Z13228 Encounter for screening for other metabolic disorders: Secondary | ICD-10-CM | POA: Diagnosis not present

## 2015-10-20 DIAGNOSIS — Z319 Encounter for procreative management, unspecified: Secondary | ICD-10-CM | POA: Diagnosis not present

## 2015-10-25 MED FILL — valACYclovir HCL 1 GM TABS: 1 | 30 days supply | Qty: 30 | Fill #7

## 2015-11-30 ENCOUNTER — Encounter: Payer: Self-pay | Admitting: Family

## 2015-11-30 ENCOUNTER — Encounter (HOSPITAL_COMMUNITY): Payer: Self-pay | Admitting: Emergency Medicine

## 2015-11-30 ENCOUNTER — Emergency Department (HOSPITAL_COMMUNITY): Payer: 59

## 2015-11-30 ENCOUNTER — Emergency Department (HOSPITAL_COMMUNITY)
Admission: EM | Admit: 2015-11-30 | Discharge: 2015-11-30 | Disposition: A | Discharge status: Home / Self Care | Payer: 59 | Attending: Emergency Medicine | Admitting: Emergency Medicine

## 2015-11-30 DIAGNOSIS — N200 Calculus of kidney: Secondary | ICD-10-CM | POA: Insufficient documentation

## 2015-11-30 DIAGNOSIS — Z79899 Other long term (current) drug therapy: Secondary | ICD-10-CM | POA: Insufficient documentation

## 2015-11-30 DIAGNOSIS — N132 Hydronephrosis with renal and ureteral calculous obstruction: Secondary | ICD-10-CM | POA: Diagnosis not present

## 2015-11-30 DIAGNOSIS — R109 Unspecified abdominal pain: Secondary | ICD-10-CM | POA: Diagnosis present

## 2015-11-30 DIAGNOSIS — N202 Calculus of kidney with calculus of ureter: Secondary | ICD-10-CM | POA: Diagnosis not present

## 2015-11-30 LAB — CBC WITH DIFFERENTIAL/PLATELET
Basophils Absolute: 0 10*3/uL (ref 0.0–0.1)
Basophils Relative: 0 %
EOS ABS: 0.1 10*3/uL (ref 0.0–0.7)
EOS PCT: 1 %
HCT: 39.6 % (ref 36.0–46.0)
HEMOGLOBIN: 13.2 g/dL (ref 12.0–15.0)
LYMPHS ABS: 3.1 10*3/uL (ref 0.7–4.0)
LYMPHS PCT: 29 %
MCH: 29.6 pg (ref 26.0–34.0)
MCHC: 33.3 g/dL (ref 30.0–36.0)
MCV: 88.8 fL (ref 78.0–100.0)
MONOS PCT: 8 %
Monocytes Absolute: 0.8 10*3/uL (ref 0.1–1.0)
Neutro Abs: 6.5 10*3/uL (ref 1.7–7.7)
Neutrophils Relative %: 62 %
Platelets: 244 10*3/uL (ref 150–400)
RBC: 4.46 MIL/uL (ref 3.87–5.11)
RDW: 12.6 % (ref 11.5–15.5)
WBC: 10.5 10*3/uL (ref 4.0–10.5)

## 2015-11-30 LAB — COMPREHENSIVE METABOLIC PANEL
ALK PHOS: 53 U/L (ref 38–126)
ALT: 13 U/L — ABNORMAL LOW (ref 14–54)
ANION GAP: 10 (ref 5–15)
AST: 16 U/L (ref 15–41)
Albumin: 3.9 g/dL (ref 3.5–5.0)
BUN: 20 mg/dL (ref 6–20)
CALCIUM: 9.1 mg/dL (ref 8.9–10.3)
CO2: 22 mmol/L (ref 22–32)
Chloride: 103 mmol/L (ref 101–111)
Creatinine, Ser: 0.93 mg/dL (ref 0.44–1.00)
GFR calc non Af Amer: >60 mL/min (ref >60)
Glucose, Bld: 118 mg/dL — ABNORMAL HIGH (ref 65–99)
Potassium: 3.6 mmol/L (ref 3.5–5.1)
SODIUM: 135 mmol/L (ref 135–145)
TOTAL PROTEIN: 7.3 g/dL (ref 6.5–8.1)

## 2015-11-30 LAB — URINALYSIS, ROUTINE W REFLEX MICROSCOPIC
BILIRUBIN URINE: NEGATIVE
GLUCOSE, UA: NEGATIVE mg/dL
KETONES UR: 15 mg/dL — AB
Nitrite: NEGATIVE
PH: 5 (ref 5.0–8.0)
Protein, ur: NEGATIVE mg/dL
Specific Gravity, Urine: 1.028 (ref 1.005–1.030)

## 2015-11-30 LAB — URINE MICROSCOPIC-ADD ON

## 2015-11-30 LAB — LIPASE, BLOOD: Lipase: 38 U/L (ref 11–51)

## 2015-11-30 LAB — POC URINE PREG, ED: Preg Test, Ur: NEGATIVE

## 2015-11-30 MED ORDER — TAMSULOSIN HCL 0.4 MG PO CAPS: 0.4000 mg | ORAL_CAPSULE | Freq: Every day | ORAL | Status: DC

## 2015-11-30 MED ORDER — MORPHINE SULFATE (PF) 4 MG/ML IV SOLN
4.0000 mg | Freq: Once | INTRAVENOUS | Status: AC
  Administered 2015-11-30: 4 mg via INTRAVENOUS
  Filled 2015-11-30: qty 1

## 2015-11-30 MED ORDER — KETOROLAC TROMETHAMINE 30 MG/ML IJ SOLN
30.0000 mg | Freq: Once | INTRAMUSCULAR | Status: AC
  Administered 2015-11-30: 30 mg via INTRAVENOUS
  Filled 2015-11-30: qty 1

## 2015-11-30 MED ORDER — ONDANSETRON 4 MG PO TBDP: 4.0000 mg | ORAL_TABLET | Freq: Three times a day (TID) | ORAL | Status: DC | PRN

## 2015-11-30 MED ORDER — OXYCODONE-ACETAMINOPHEN 5-325 MG PO TABS: 1.0000 | ORAL_TABLET | Freq: Four times a day (QID) | ORAL | Status: DC | PRN

## 2015-11-30 MED ORDER — ONDANSETRON HCL 4 MG/2ML IJ SOLN
4.0000 mg | Freq: Once | INTRAMUSCULAR | Status: AC
  Administered 2015-11-30: 4 mg via INTRAVENOUS
  Filled 2015-11-30: qty 2

## 2015-11-30 MED ORDER — SODIUM CHLORIDE 0.9 % IV BOLUS (SEPSIS)
1000.0000 mL | Freq: Once | INTRAVENOUS | Status: AC
  Administered 2015-11-30: 1000 mL via INTRAVENOUS

## 2015-11-30 MED FILL — ONDANSETRON ODT 4 MG TABLET: 4 | 3 days supply | Qty: 10 | Fill #0

## 2015-11-30 MED FILL — valACYclovir HCL 1 GM TABS: 1 | 30 days supply | Qty: 30 | Fill #8

## 2015-11-30 MED FILL — OXYCODONE/APAP 5/325 MG TAB: 5-325 | 2 days supply | Qty: 10 | Fill #0

## 2015-11-30 MED FILL — TAMSULOSIN HCL 0.4 MG CAP: 0.4 | 20 days supply | Qty: 20 | Fill #0

## 2015-11-30 NOTE — ED Provider Notes (Signed)
CSN: [id number]     Arrival date & time 11/30/15  0139 History   First MD Initiated Contact with Patient 11/30/15 0320     Chief Complaint  Patient presents with  . Flank Pain     (Consider location/radiation/quality/duration/timing/severity/associated sxs/prior Treatment) HPI   Patient is a 35 year old female presents emergency department with severe, 10 out of 10 right flank pain that began suddenly tonight, associated with nausea and vomiting. Pain is located in right flank and radiates towards her right hip. She denies dysuria, hematuria, fever, chills, diarrhea.  Past Medical History  Diagnosis Date  . HSV infection   . Cervical dysplasia 2004    no treatment, normal paps after   Past Surgical History  Procedure Laterality Date  . Colposcopy    . Esophagogastroduodenoscopy     Family History  Problem Relation Age of Onset  . Hypertension Maternal Grandmother   . Heart disease Father    Social History  Substance Use Topics  . Smoking status: Never Smoker   . Smokeless tobacco: Never Used  . Alcohol Use: No     Comment: Rare   OB History    Gravida Para Term Preterm AB TAB SAB Ectopic Multiple Living   2    2     0     Review of Systems  All other systems reviewed and are negative.     Allergies  Penicillins and Latex  Home Medications   Prior to Admission medications   Medication Sig Start Date End Date Taking? Authorizing Provider  Multiple Vitamins-Minerals (MULTIVITAMIN & MINERAL PO) Take by mouth.    Historical Provider, MD  omeprazole (PRILOSEC) 40 MG capsule Take 1 capsule (40 mg total) by mouth daily before breakfast. 12/20/14   Rachael Fee, MD  valACYclovir (VALTREX) 1000 MG tablet Take 1,000 mg by mouth daily. 1 tab by mouth daily    Historical Provider, MD  valACYclovir (VALTREX) 1000 MG tablet TAKE 1 TABLET BY MOUTH ONCE DAILY 03/08/15   Dara Lords, MD   BP 103/71 mmHg  Pulse 98  Temp(Src) 97.9 F (36.6 C) (Oral)  Resp 16  Ht   (1.676 m)  Wt 95 kg  BMI 33.82 kg/m2  SpO2 97%  LMP 11/09/2015 (Approximate) Physical Exam  Constitutional: She is oriented to person, place, and time. She appears well-developed and well-nourished. She appears distressed.  HENT:  Head: Normocephalic and atraumatic.  Nose: Nose normal.  Mouth/Throat: Oropharynx is clear and moist. No oropharyngeal exudate.  Eyes: Conjunctivae and EOM are normal. Pupils are equal, round, and reactive to light. Right eye exhibits no discharge. Left eye exhibits no discharge. No scleral icterus.  Neck: Normal range of motion. No JVD present. No tracheal deviation present. No thyromegaly present.  Cardiovascular: Normal rate, regular rhythm, normal heart sounds and intact distal pulses.  Exam reveals no gallop and no friction rub.   No murmur heard. Pulmonary/Chest: Effort normal and breath sounds normal. No respiratory distress. She has no wheezes. She has no rales. She exhibits no tenderness.  Abdominal: Soft. Bowel sounds are normal. She exhibits no distension and no mass. There is tenderness. There is guarding. There is no rebound.  Right CVA tenderness  Musculoskeletal: Normal range of motion. She exhibits no edema or tenderness.  Lymphadenopathy:    She has no cervical adenopathy.  Neurological: She is alert and oriented to person, place, and time. She has normal reflexes. No cranial nerve deficit. She exhibits normal muscle tone. Coordination normal.  Skin: Skin is warm and dry. No rash noted. She is not diaphoretic. No erythema. No pallor.  Psychiatric: She has a normal mood and affect. Her behavior is normal. Judgment and thought content normal.  Nursing note and vitals reviewed.   ED Course  Procedures (including critical care time) Labs Review Labs Reviewed  COMPREHENSIVE METABOLIC PANEL - Abnormal; Notable for the following:    Glucose, Bld 118 (*)    ALT 13 (*)    Total Bilirubin <0.1 (*)    All other components within normal limits   URINALYSIS, ROUTINE W REFLEX MICROSCOPIC (NOT AT [facility]) - Abnormal; Notable for the following:    APPearance CLOUDY (*)    Hgb urine dipstick LARGE (*)    Ketones, ur 15 (*)    Leukocytes, UA SMALL (*)    All other components within normal limits  URINE MICROSCOPIC-ADD ON - Abnormal; Notable for the following:    Squamous Epithelial / LPF 0-5 (*)    Bacteria, UA RARE (*)    Crystals CA OXALATE CRYSTALS (*)    All other components within normal limits  CBC WITH DIFFERENTIAL/PLATELET  LIPASE, BLOOD  POC URINE PREG, ED    Imaging Review Ct Renal Stone Study  11/30/2015  CLINICAL DATA:  35 year old female with right flank pain. EXAM: CT ABDOMEN AND PELVIS WITHOUT CONTRAST TECHNIQUE: Multidetector CT imaging of the abdomen and pelvis was performed following the standard protocol without IV contrast. COMPARISON:  Abdominal ultrasound dated 07/13/2014 FINDINGS: Evaluation of this exam is limited in the absence of intravenous contrast. The visualized lung bases are clear. No intra-abdominal free air or free fluid. The liver, gallbladder, pancreas, spleen, adrenal glands appear unremarkable. The left kidney is unremarkable. There is a 4 mm distal right ureteral stone with mild right hydronephrosis. A 3 mm nonobstructing right renal inferior pole calculus is also noted. The urinary bladder is grossly unremarkable. The uterus is anteverted. The ovaries are grossly unremarkable. Evaluation of the bowel is limited in the absence of oral contrast. There is no evidence of bowel obstruction or active inflammation. Small appendicolith noted within the appendix. The appendix is otherwise unremarkable. The abdominal aorta and IVC appear grossly unremarkable on this noncontrast study. No portal venous gas identified. There is no adenopathy. There is a midline vertical anterior abdominal wall incisional scar. The abdominal wall soft tissues are otherwise unremarkable. The osseous structures are intact. IMPRESSION: A 4  mm distal right ureteral calculus with mild right hydronephrosis. A 3 mm nonobstructing right renal inferior pole calculus. Electronically Signed   By: Elgie CollardArash  Radparvar M.D.   On: 11/30/2015 04:50   I have personally reviewed and evaluated these images and lab results as part of my medical decision-making.   EKG Interpretation None      MDM   35 year old female presents with sudden onset right flank pain with nausea and vomiting, denies urinary symptoms.  No symptoms prior to onset of pain this evening.  Basic labs obtained, presentation most suspicious for nephrolithiasis, renal study ordered.  Workup significant for hematuria, small leukocytes, negative nitrites.  CT scan pertinent for 4 mm distal right ureteral calculus with mild right hydronephrosis.   She was given toradol, IVF, zofran and morphine in there ER.  Pt was reevaluated, Pt reports pain is completely relieved, 0/10.  She is tolerating PO's, VSS.  She will be discharged home with pain and nausea meds and flomax, to follow up with urology or PCP.  Discharged in good condition. VSS. Filed Vitals:  11/30/15 0142 11/30/15 0324 11/30/15 0430 11/30/15 0529  BP: 120/76 115/77 103/71 112/78  Pulse: 83 89 98 90  Temp: 97.3 F (36.3 C) 97.9 F (36.6 C)    TempSrc: Oral Oral    Resp: 20 18 16 16   Height: 5\' 6"  (1.676 m)     Weight: 95 kg     SpO2: 96% 97% 97% 97%     Final diagnoses:  Nephrolithiasis      Danelle BerryLeisa Tonie Vizcarrondo, PA-C 12/03/15 0054  Danelle BerryLeisa Mykel Mohl, PA-C 12/03/15 [id number]  Shon Batonourtney F Horton, MD 12/03/15 2021

## 2015-11-30 NOTE — ED Notes (Signed)
Pt. woke up with right flank pain , nausea , and emesis this evening , denies dysuria or hematuria .

## 2015-11-30 NOTE — Discharge Instructions (Signed)
Kidney Stones °Kidney stones (urolithiasis) are deposits that form inside your kidneys. The intense pain is caused by the stone moving through the urinary tract. When the stone moves, the ureter goes into spasm around the stone. The stone is usually passed in the urine.  °CAUSES  °· A disorder that makes certain neck glands produce too much parathyroid hormone (primary hyperparathyroidism). °· A buildup of uric acid crystals, similar to gout in your joints. °· Narrowing (stricture) of the ureter. °· A kidney obstruction present at birth (congenital obstruction). °· Previous surgery on the kidney or ureters. °· Numerous kidney infections. °SYMPTOMS  °· Feeling sick to your stomach (nauseous). °· Throwing up (vomiting). °· Blood in the urine (hematuria). °· Pain that usually spreads (radiates) to the groin. °· Frequency or urgency of urination. °DIAGNOSIS  °· Taking a history and physical exam. °· Blood or urine tests. °· CT scan. °· Occasionally, an examination of the inside of the urinary bladder (cystoscopy) is performed. °TREATMENT  °· Observation. °· Increasing your fluid intake. °· Extracorporeal shock wave lithotripsy--This is a noninvasive procedure that uses shock waves to break up kidney stones. °· Surgery may be needed if you have severe pain or persistent obstruction. There are various surgical procedures. Most of the procedures are performed with the use of small instruments. Only small incisions are needed to accommodate these instruments, so recovery time is minimized. °The size, location, and chemical composition are all important variables that will determine the proper choice of action for you. Talk to your health care provider to better understand your situation so that you will minimize the risk of injury to yourself and your kidney.  °HOME CARE INSTRUCTIONS  °· Drink enough water and fluids to keep your urine clear or pale yellow. This will help you to pass the stone or stone fragments. °· Strain  all urine through the provided strainer. Keep all particulate matter and stones for your health care provider to see. The stone causing the pain may be as small as a grain of salt. It is very important to use the strainer each and every time you pass your urine. The collection of your stone will allow your health care provider to analyze it and verify that a stone has actually passed. The stone analysis will often identify what you can do to reduce the incidence of recurrences. °· Only take over-the-counter or prescription medicines for pain, discomfort, or fever as directed by your health care provider. °· Keep all follow-up visits as told by your health care provider. This is important. °· Get follow-up X-rays if required. The absence of pain does not always mean that the stone has passed. It may have only stopped moving. If the urine remains completely obstructed, it can cause loss of kidney function or even complete destruction of the kidney. It is your responsibility to make sure X-rays and follow-ups are completed. Ultrasounds of the kidney can show blockages and the status of the kidney. Ultrasounds are not associated with any radiation and can be performed easily in a matter of minutes. °· Make changes to your daily diet as told by your health care provider. You may be told to: °¨ Limit the amount of salt that you eat. °¨ Eat 5 or more servings of fruits and vegetables each day. °¨ Limit the amount of meat, poultry, fish, and eggs that you eat. °· Collect a 24-hour urine sample as told by your health care provider. You may need to collect another urine sample every 6-12   months. °SEEK MEDICAL CARE IF: °· You experience pain that is progressive and unresponsive to any pain medicine you have been prescribed. °SEEK IMMEDIATE MEDICAL CARE IF:  °· Pain cannot be controlled with the prescribed medicine. °· You have a fever or shaking chills. °· The severity or intensity of pain increases over 18 hours and is not  relieved by pain medicine. °· You develop a new onset of abdominal pain. °· You feel faint or pass out. °· You are unable to urinate. °  °This information is not intended to replace advice given to you by your health care provider. Make sure you discuss any questions you have with your health care provider. °  °Document Released: 05/27/2005 Document Revised: 02/15/2015 Document Reviewed: 10/28/2012 °Elsevier Interactive Patient Education ©2016 [employer] ° °

## 2015-11-30 NOTE — ED Notes (Signed)
Called main lab to add on lipase.  

## 2015-11-30 NOTE — ED Notes (Signed)
EDP at bedside  

## 2015-12-01 ENCOUNTER — Encounter (HOSPITAL_COMMUNITY): Payer: Self-pay | Admitting: Emergency Medicine

## 2015-12-01 ENCOUNTER — Emergency Department (HOSPITAL_COMMUNITY)
Admission: EM | Admit: 2015-12-01 | Discharge: 2015-12-01 | Disposition: A | Discharge status: Home / Self Care | Payer: 59 | Attending: Emergency Medicine | Admitting: Emergency Medicine

## 2015-12-01 DIAGNOSIS — N201 Calculus of ureter: Secondary | ICD-10-CM | POA: Insufficient documentation

## 2015-12-01 DIAGNOSIS — Z79899 Other long term (current) drug therapy: Secondary | ICD-10-CM | POA: Diagnosis not present

## 2015-12-01 DIAGNOSIS — R112 Nausea with vomiting, unspecified: Secondary | ICD-10-CM

## 2015-12-01 LAB — URINALYSIS, ROUTINE W REFLEX MICROSCOPIC
Bilirubin Urine: NEGATIVE
GLUCOSE, UA: NEGATIVE mg/dL
Ketones, ur: 15 mg/dL — AB
Nitrite: NEGATIVE
PH: 6 (ref 5.0–8.0)
PROTEIN: NEGATIVE mg/dL
Specific Gravity, Urine: 1.012 (ref 1.005–1.030)

## 2015-12-01 LAB — URINE MICROSCOPIC-ADD ON

## 2015-12-01 MED ORDER — HYDROMORPHONE HCL 1 MG/ML IJ SOLN
1.0000 mg | INTRAMUSCULAR | Status: DC | PRN
  Administered 2015-12-01: 1 mg via INTRAVENOUS
  Filled 2015-12-01: qty 1

## 2015-12-01 MED ORDER — KETOROLAC TROMETHAMINE 30 MG/ML IJ SOLN
30.0000 mg | Freq: Once | INTRAMUSCULAR | Status: AC
  Administered 2015-12-01: 30 mg via INTRAVENOUS
  Filled 2015-12-01: qty 1

## 2015-12-01 MED ORDER — SCOPOLAMINE 1 MG/3DAYS TD PT72
1.0000 | MEDICATED_PATCH | TRANSDERMAL | Status: DC
  Administered 2015-12-01: 1.5 mg via TRANSDERMAL
  Filled 2015-12-01: qty 1

## 2015-12-01 MED ORDER — HYDROCODONE-ACETAMINOPHEN 5-325 MG PO TABS: 2.0000 | ORAL_TABLET | ORAL | Status: DC | PRN

## 2015-12-01 MED ORDER — ONDANSETRON HCL 4 MG/2ML IJ SOLN
4.0000 mg | Freq: Once | INTRAMUSCULAR | Status: AC
  Administered 2015-12-01: 4 mg via INTRAVENOUS
  Filled 2015-12-01: qty 2

## 2015-12-01 MED ORDER — HYDROCODONE-ACETAMINOPHEN 5-325 MG PO TABS
1.0000 | ORAL_TABLET | Freq: Once | ORAL | Status: AC
Start: 2015-12-01 — End: 2015-12-01
  Administered 2015-12-01: 1 via ORAL
  Filled 2015-12-01: qty 1

## 2015-12-01 MED ORDER — SODIUM CHLORIDE 0.9 % IV BOLUS (SEPSIS)
1000.0000 mL | Freq: Once | INTRAVENOUS | Status: AC
  Administered 2015-12-01: 1000 mL via INTRAVENOUS

## 2015-12-01 MED FILL — HYDROCODON-APAP 5-325: 5-325 | 2 days supply | Qty: 20 | Fill #0

## 2015-12-01 NOTE — ED Notes (Signed)
Discharge instructions, follow up care, rx x1 reviewed with patient. Patient verbalized understanding.

## 2015-12-01 NOTE — ED Notes (Signed)
Se[facility] recently and dx w/ kidney stone to right side. States the Oxy and zofran aren't helping her pain and nausea, having difficulty urinating d/t pain, unable to keep down food or fluids

## 2015-12-01 NOTE — Discharge Instructions (Signed)
Continue your medications at home.  Try Vicodin, instead of the oxycodone which seemed worsening or nausea.  Follow-up with [facility] days if not symptom free

## 2015-12-01 NOTE — ED Provider Notes (Signed)
CSN: [id number]     Arrival date & time 12/01/15  0804 History   First MD Initiated Contact with Patient 12/01/15 [phone]     Chief Complaint  Patient presents with  . Flank Pain      HPI    Teresa Harvey presents for evaluation of right flank pain from a kidney stone diagnosed yesterday [facility].  A 4 mm distal right ureteral stone based on CT yesterday. Was discharged home feeling well. Has prescriptions for Zofran and oxycodone. She is not keeping medicine down very long. She states she simply touch the Zofran to her tongue and began retching. Down the oxycodone less than half an hour. Has worsening pain and presents here.   Past Medical History  Diagnosis Date  . HSV infection   . Cervical dysplasia 2004    no treatment, normal paps after   Past Surgical History  Procedure Laterality Date  . Colposcopy    . Esophagogastroduodenoscopy     Family History  Problem Relation Age of Onset  . Hypertension Maternal Grandmother   . Heart disease Father    Social History  Substance Use Topics  . Smoking status: Never Smoker   . Smokeless tobacco: Never Used  . Alcohol Use: No     Comment: Rare   OB History    Gravida Para Term Preterm AB TAB SAB Ectopic Multiple Living   2    2     0     Review of Systems  Constitutional: Negative for fever, chills, diaphoresis, appetite change and fatigue.  HENT: Negative for mouth sores, sore throat and trouble swallowing.   Eyes: Negative for visual disturbance.  Respiratory: Negative for cough, chest tightness, shortness of breath and wheezing.   Cardiovascular: Negative for chest pain.  Gastrointestinal: Positive for abdominal pain. Negative for nausea, vomiting, diarrhea and abdominal distention.  Endocrine: Negative for polydipsia, polyphagia and polyuria.  Genitourinary: Positive for dysuria and flank pain. Negative for frequency and hematuria.  Musculoskeletal: Negative for gait problem.  Skin: Negative for color  change, pallor and rash.  Neurological: Negative for dizziness, syncope, light-headedness and headaches.  Hematological: Does not bruise/bleed easily.  Psychiatric/Behavioral: Negative for behavioral problems and confusion.      Allergies  Penicillins and Latex  Home Medications   Prior to Admission medications   Medication Sig Start Date End Date Taking? Authorizing Provider  Multiple Vitamins-Minerals (MULTIVITAMIN & MINERAL PO) Take 1 tablet by mouth daily.    Yes Historical Provider, MD  ondansetron (ZOFRAN ODT) 4 MG disintegrating tablet Take 1 tablet (4 mg total) by mouth every 8 (eight) hours as needed for nausea or vomiting. 11/30/15  Yes Danelle Berry, PA-C  oxyCODONE-acetaminophen (PERCOCET) 5-325 MG tablet Take 1 tablet by mouth every 6 (six) hours as needed for severe pain. 11/30/15  Yes Danelle Berry, PA-C  tamsulosin (FLOMAX) 0.4 MG CAPS capsule Take 1 capsule (0.4 mg total) by mouth daily. 11/30/15  Yes Danelle Berry, PA-C  valACYclovir (VALTREX) 1000 MG tablet TAKE 1 TABLET BY MOUTH ONCE DAILY 03/08/15  Yes Dara Lords, MD  HYDROcodone-acetaminophen (NORCO/VICODIN) 5-325 MG tablet Take 2 tablets by mouth every 4 (four) hours as needed. 12/01/15   Rolland Porter, MD  omeprazole (PRILOSEC) 40 MG capsule Take 1 capsule (40 mg total) by mouth daily before breakfast. Patient not taking: Reported on 12/01/2015 12/20/14   Rachael Fee, MD   BP 118/81 mmHg  Pulse 75  Temp(Src) 97.6 F (36.4 C) (Oral)  Resp 16  SpO2 99%  LMP 11/09/2015 (Approximate) Physical Exam  Constitutional: She is oriented to person, place, and time. She appears well-developed and well-nourished. She appears distressed.  Laying on her left side. Right hand holding her right lower abdomen. Complaining of severe pain.  HENT:  Head: Normocephalic.  Eyes: Conjunctivae are normal. Pupils are equal, round, and reactive to light. No scleral icterus.  Neck: Normal range of motion. Neck supple. No thyromegaly  present.  Cardiovascular: Normal rate and regular rhythm.  Exam reveals no gallop and no friction rub.   No murmur heard. Pulmonary/Chest: Effort normal and breath sounds normal. No respiratory distress. She has no wheezes. She has no rales.  Abdominal: Soft. Bowel sounds are normal. She exhibits no distension. There is no tenderness. There is no rebound.  Abdomen soft and nondistended. No peritoneal irritation. Colicky and intermittent episodes of pain.  Musculoskeletal: Normal range of motion.  Neurological: She is alert and oriented to person, place, and time.  Skin: Skin is warm and dry. No rash noted.  Psychiatric: She has a normal mood and affect. Her behavior is normal.    ED Course  Procedures (including critical care time) Labs Review Labs Reviewed  URINALYSIS, ROUTINE W REFLEX MICROSCOPIC (NOT AT [facility]) - Abnormal; Notable for the following:    Hgb urine dipstick TRACE (*)    Ketones, ur 15 (*)    Leukocytes, UA SMALL (*)    All other components within normal limits  URINE MICROSCOPIC-ADD ON - Abnormal; Notable for the following:    Squamous Epithelial / LPF 0-5 (*)    Bacteria, UA FEW (*)    All other components within normal limits    Imaging Review Ct Renal Stone Study  11/30/2015  CLINICAL DATA:  35 year old female with right flank pain. EXAM: CT ABDOMEN AND PELVIS WITHOUT CONTRAST TECHNIQUE: Multidetector CT imaging of the abdomen and pelvis was performed following the standard protocol without IV contrast. COMPARISON:  Abdominal ultrasound dated 07/13/2014 FINDINGS: Evaluation of this exam is limited in the absence of intravenous contrast. The visualized lung bases are clear. No intra-abdominal free air or free fluid. The liver, gallbladder, pancreas, spleen, adrenal glands appear unremarkable. The left kidney is unremarkable. There is a 4 mm distal right ureteral stone with mild right hydronephrosis. A 3 mm nonobstructing right renal inferior pole calculus is also noted.  The urinary bladder is grossly unremarkable. The uterus is anteverted. The ovaries are grossly unremarkable. Evaluation of the bowel is limited in the absence of oral contrast. There is no evidence of bowel obstruction or active inflammation. Small appendicolith noted within the appendix. The appendix is otherwise unremarkable. The abdominal aorta and IVC appear grossly unremarkable on this noncontrast study. No portal venous gas identified. There is no adenopathy. There is a midline vertical anterior abdominal wall incisional scar. The abdominal wall soft tissues are otherwise unremarkable. The osseous structures are intact. IMPRESSION: A 4 mm distal right ureteral calculus with mild right hydronephrosis. A 3 mm nonobstructing right renal inferior pole calculus. Electronically Signed   By: Elgie CollardArash  Radparvar M.D.   On: 11/30/2015 04:50   I have personally reviewed and evaluated these images and lab results as part of my medical decision-making.   EKG Interpretation None      MDM   Final diagnoses:  Non-intractable vomiting with nausea, vomiting of unspecified type  Ureteral stone    It seems as though she simply not able to tolerate medications long enough to have the benefit of  the analgesic effects. Plan will be symptom control hydration and medical control scopolamine patch recheck urine for infection. Reevaluation.  On reevaluation she states her pain is gone. Minimal nausea. Trying some by mouth. We'll try by mouth Vicodin here tolerating liquids and medicines will be appropriate for discharge. I told her that the scopolamine patch should be worn until her symptoms are gone and she is tolerating. She can expect dry mouth. Asked her to stay hydrated and avoid frequent avoid urinary retention. Urology follow-up if not improving in the next 7 days.    Rolland PorterMark Antonique Langford, MD 12/01/15 1150

## 2015-12-07 DIAGNOSIS — N202 Calculus of kidney with calculus of ureter: Secondary | ICD-10-CM | POA: Diagnosis not present

## 2015-12-28 DIAGNOSIS — N201 Calculus of ureter: Secondary | ICD-10-CM | POA: Diagnosis not present

## 2016-01-08 MED FILL — valACYclovir HCL 1 GM TABS: 1 | 30 days supply | Qty: 30 | Fill #9

## 2016-02-29 ENCOUNTER — Encounter: Payer: Self-pay | Admitting: Gynecology

## 2016-03-01 ENCOUNTER — Encounter: Payer: 59 | Admitting: Gynecology

## 2016-03-06 DIAGNOSIS — N978 Female infertility of other origin: Secondary | ICD-10-CM | POA: Diagnosis not present

## 2016-03-06 DIAGNOSIS — Z319 Encounter for procreative management, unspecified: Secondary | ICD-10-CM | POA: Diagnosis not present

## 2016-03-07 MED FILL — DOXYCYCLINE HYCLATE 100 MG: 100 | 5 days supply | Qty: 10 | Fill #0

## 2016-03-07 MED FILL — SYNAREL 2 MG/ML NASAL SPRAY: 2 | 30 days supply | Qty: 8 | Fill #0

## 2016-03-08 ENCOUNTER — Ambulatory Visit (INDEPENDENT_AMBULATORY_CARE_PROVIDER_SITE_OTHER): Payer: 59 | Admitting: Gynecology

## 2016-03-08 ENCOUNTER — Encounter: Payer: Self-pay | Admitting: Gynecology

## 2016-03-08 VITALS — BP 120/76 | Ht 67.0 in | Wt 210.0 lb

## 2016-03-08 DIAGNOSIS — Z13220 Encounter for screening for lipoid disorders: Secondary | ICD-10-CM

## 2016-03-08 DIAGNOSIS — Z01419 Encounter for gynecological examination (general) (routine) without abnormal findings: Secondary | ICD-10-CM | POA: Diagnosis not present

## 2016-03-08 LAB — CBC WITH DIFFERENTIAL/PLATELET
BASOS PCT: 1 %
Basophils Absolute: 59 cells/uL (ref 0–200)
EOS PCT: 1 %
Eosinophils Absolute: 59 cells/uL (ref 15–500)
HEMATOCRIT: 41.4 % (ref 35.0–45.0)
Hemoglobin: 13.6 g/dL (ref 11.7–15.5)
LYMPHS ABS: 1770 {cells}/uL (ref 850–3900)
LYMPHS PCT: 30 %
MCH: 29.8 pg (ref 27.0–33.0)
MCHC: 32.9 g/dL (ref 32.0–36.0)
MCV: 90.8 fL (ref 80.0–100.0)
MONOS PCT: 9 %
MPV: 9.8 fL (ref 7.5–12.5)
Monocytes Absolute: 531 cells/uL (ref 200–950)
NEUTROS ABS: 3481 {cells}/uL (ref 1500–7800)
Neutrophils Relative %: 59 %
PLATELETS: 284 10*3/uL (ref 140–400)
RBC: 4.56 MIL/uL (ref 3.80–5.10)
RDW: 13.4 % (ref 11.0–15.0)
WBC: 5.9 10*3/uL (ref 3.8–10.8)

## 2016-03-08 LAB — COMPREHENSIVE METABOLIC PANEL
ALK PHOS: 51 U/L (ref 33–115)
ALT: 10 U/L (ref 6–29)
AST: 14 U/L (ref 10–30)
Albumin: 4.1 g/dL (ref 3.6–5.1)
BILIRUBIN TOTAL: 0.4 mg/dL (ref 0.2–1.2)
BUN: 16 mg/dL (ref 7–25)
CALCIUM: 9.1 mg/dL (ref 8.6–10.2)
CO2: 27 mmol/L (ref 20–31)
Chloride: 103 mmol/L (ref 98–110)
Creat: 0.78 mg/dL (ref 0.50–1.10)
GLUCOSE: 55 mg/dL — AB (ref 65–99)
Potassium: 3.8 mmol/L (ref 3.5–5.3)
SODIUM: 138 mmol/L (ref 135–146)
Total Protein: 7.6 g/dL (ref 6.1–8.1)

## 2016-03-08 LAB — LIPID PANEL
Cholesterol: 151 mg/dL (ref 125–200)
HDL: 56 mg/dL (ref >=46)
LDL Cholesterol: 86 mg/dL (ref <130)
Total CHOL/HDL Ratio: 2.7 Ratio (ref <=5.0)
Triglycerides: 45 mg/dL (ref <150)
VLDL: 9 mg/dL (ref <30)

## 2016-03-08 LAB — URINALYSIS W MICROSCOPIC + REFLEX CULTURE
Bacteria, UA: NONE SEEN [HPF]
Bilirubin Urine: NEGATIVE
CASTS: NONE SEEN [LPF]
Crystals: NONE SEEN [HPF]
Glucose, UA: NEGATIVE
HGB URINE DIPSTICK: NEGATIVE
Ketones, ur: NEGATIVE
Leukocytes, UA: NEGATIVE
NITRITE: NEGATIVE
PH: 5.5 (ref 5.0–8.0)
PROTEIN: NEGATIVE
RBC / HPF: NONE SEEN RBC/HPF (ref <=2)
Specific Gravity, Urine: 1.024 (ref 1.001–1.035)
WBC, UA: NONE SEEN WBC/HPF (ref <=5)
YEAST: NONE SEEN [HPF]

## 2016-03-08 MED ORDER — VALACYCLOVIR HCL 1 G PO TABS: 1000.0000 mg | ORAL_TABLET | Freq: Every day | ORAL | 12 refills | Status: DC

## 2016-03-08 NOTE — Progress Notes (Signed)
    Teresa Harvey 1980-12-13 [id number]        35 y.o.  G2P0020  for annual exam.  Doing well.  Past medical history,surgical history, problem list, medications, allergies, family history and social history were all reviewed and documented as reviewed in the EPIC chart.  ROS:  Performed with pertinent positives and negatives included in the history, assessment and plan.   Additional significant findings :  None   Exam: Kennon PortelaKim Gardner assistant Vitals:   03/08/16 0934  BP: 120/76  Weight: 210 lb (95.3 kg)  Height: 5\' 7"  (1.702 m)   Body mass index is 32.89 kg/m.  General appearance:  Normal affect, orientation and appearance. Skin: Grossly normal HEENT: Without gross lesions.  No cervical or supraclavicular adenopathy. Thyroid normal.  Lungs:  Clear without wheezing, rales or rhonchi Cardiac: RR, without RMG Abdominal:  Soft, nontender, without masses, guarding, rebound, organomegaly or hernia Breasts:  Examined lying and sitting without masses, retractions, discharge or axillary adenopathy. Pelvic:  Ext, BUS, Vagina normal  Cervix normal  Uterus anteverted, normal size, shape and contour, midline and mobile nontender   Adnexa without masses or tenderness    Anus and perineum normal   Rectovaginal normal sphincter tone without palpated masses or tenderness.    Assessment/Plan:  35 y.o. 522P0020 female for annual exam with regular menses, pursuing pregnancy.   1. In the process of arranging in vitro fertilization with Dr. April MansonYalcinkaya. On a prenatal vitamin daily. 2. Pap smear/HPV negative 2014.  No Pap smear done today. History of dysplasia 2004 with no treatment and normal Pap smears afterwards. 3. SBE monthly reviewed. Will plan mammography at age 89. 4. Takes Valtrex 1000 mg daily for HSV suppression. Refill provided. Knows to stop when initiates IVF cycle. 5. Baseline CBC, CMP, lipid profile, urinalysis ordered. Follow up with Dr. April MansonYalcinkaya otherwise annual GYN exam in  one year.   Dara LordsFONTAINE,TIMOTHY P MD, 10:29 AM 03/08/2016

## 2016-03-08 NOTE — Patient Instructions (Signed)

## 2016-03-09 DIAGNOSIS — Z319 Encounter for procreative management, unspecified: Secondary | ICD-10-CM | POA: Diagnosis not present

## 2016-03-09 DIAGNOSIS — N978 Female infertility of other origin: Secondary | ICD-10-CM | POA: Diagnosis not present

## 2016-03-15 ENCOUNTER — Encounter: Payer: Self-pay | Admitting: Gynecology

## 2016-03-15 MED FILL — PROMETHAZINE 12.5 MG TABLET: 12.5 | 2 days supply | Qty: 10 | Fill #0

## 2016-03-15 MED FILL — traMADol HCL 50 MG TABS: 50 | 1 days supply | Qty: 4 | Fill #0

## 2016-03-15 NOTE — Telephone Encounter (Signed)
Yes. Would like to get ultrasound report from Dr. Lyndal RainbowYalcinkaya's office first and then we can schedule it

## 2016-04-05 DIAGNOSIS — N856 Intrauterine synechiae: Secondary | ICD-10-CM | POA: Diagnosis not present

## 2016-04-05 DIAGNOSIS — N8500 Endometrial hyperplasia, unspecified: Secondary | ICD-10-CM | POA: Diagnosis not present

## 2016-04-05 MED FILL — ESTRADIOL 2 MG TABLET: 2 | 30 days supply | Qty: 90 | Fill #0

## 2016-05-06 MED FILL — valACYclovir HCL 1 GM TABS: 1 | 30 days supply | Qty: 30 | Fill #0

## 2016-06-05 DIAGNOSIS — Z3141 Encounter for fertility testing: Secondary | ICD-10-CM | POA: Diagnosis not present

## 2016-06-05 DIAGNOSIS — Z319 Encounter for procreative management, unspecified: Secondary | ICD-10-CM | POA: Diagnosis not present

## 2016-06-05 DIAGNOSIS — Z113 Encounter for screening for infections with a predominantly sexual mode of transmission: Secondary | ICD-10-CM | POA: Diagnosis not present

## 2016-06-05 MED FILL — DOXYCYCLINE HYCLATE 100 MG: 100 | 5 days supply | Qty: 10 | Fill #0

## 2016-06-24 DIAGNOSIS — Z3183 Encounter for assisted reproductive fertility procedure cycle: Secondary | ICD-10-CM | POA: Diagnosis not present

## 2016-06-29 DIAGNOSIS — Z3183 Encounter for assisted reproductive fertility procedure cycle: Secondary | ICD-10-CM | POA: Diagnosis not present

## 2016-06-29 DIAGNOSIS — Z113 Encounter for screening for infections with a predominantly sexual mode of transmission: Secondary | ICD-10-CM | POA: Diagnosis not present

## 2016-07-01 DIAGNOSIS — Z3183 Encounter for assisted reproductive fertility procedure cycle: Secondary | ICD-10-CM | POA: Diagnosis not present

## 2016-07-03 DIAGNOSIS — Z3183 Encounter for assisted reproductive fertility procedure cycle: Secondary | ICD-10-CM | POA: Diagnosis not present

## 2016-07-04 MED FILL — OXYCODONE W/APAP 5/325 TAB: 5-325 | 3 days supply | Qty: 15 | Fill #0

## 2016-07-04 MED FILL — PROMETHAZINE 12.5 MG TABLET: 12.5 | 5 days supply | Qty: 15 | Fill #0

## 2016-07-05 DIAGNOSIS — Z3183 Encounter for assisted reproductive fertility procedure cycle: Secondary | ICD-10-CM | POA: Diagnosis not present

## 2016-07-10 DIAGNOSIS — Z3183 Encounter for assisted reproductive fertility procedure cycle: Secondary | ICD-10-CM | POA: Diagnosis not present

## 2016-07-18 DIAGNOSIS — Z3200 Encounter for pregnancy test, result unknown: Secondary | ICD-10-CM | POA: Diagnosis not present

## 2016-07-22 DIAGNOSIS — Z3183 Encounter for assisted reproductive fertility procedure cycle: Secondary | ICD-10-CM | POA: Diagnosis not present

## 2016-07-22 DIAGNOSIS — Z113 Encounter for screening for infections with a predominantly sexual mode of transmission: Secondary | ICD-10-CM | POA: Diagnosis not present

## 2016-07-23 IMAGING — CT CT RENAL STONE PROTOCOL
2 of 4 series · 16 of 46 positions shown, 18 images · non-contrast
Comparison: Abdominal ultrasound dated 07/13/2014

CLINICAL DATA: 34-year-old female with right flank pain.

EXAM:
CT ABDOMEN AND PELVIS WITHOUT CONTRAST
TECHNIQUE: Multidetector CT imaging of the abdomen and pelvis was performed
following the standard protocol without IV contrast.

[Series 2: renal stone 5mm · axial · 0.80mm/px · z∈[-542,-82]mm · 13 of 102 slices shown, 15 images]
[im 5/102  soft-tissue]
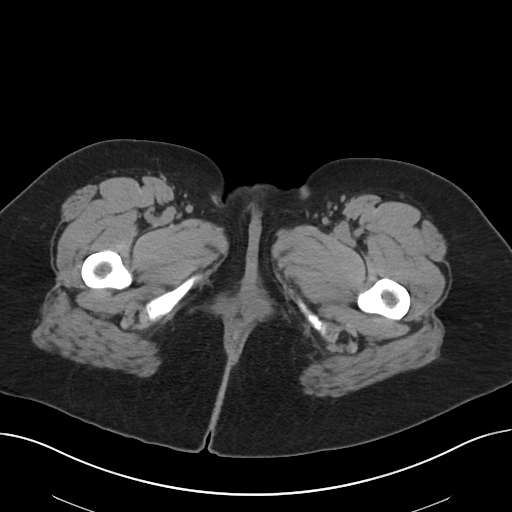
[im 5/102  bone]
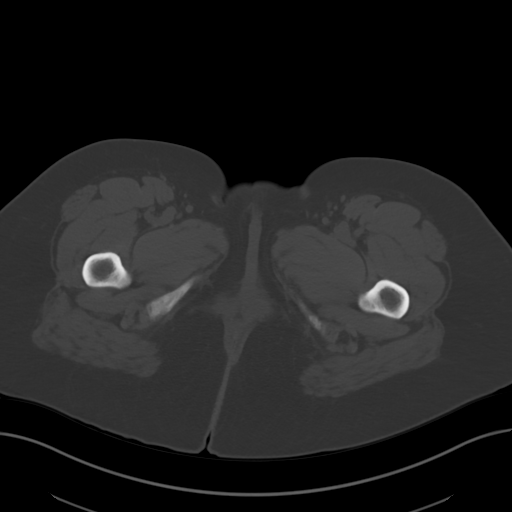
[im 14/102  soft-tissue]
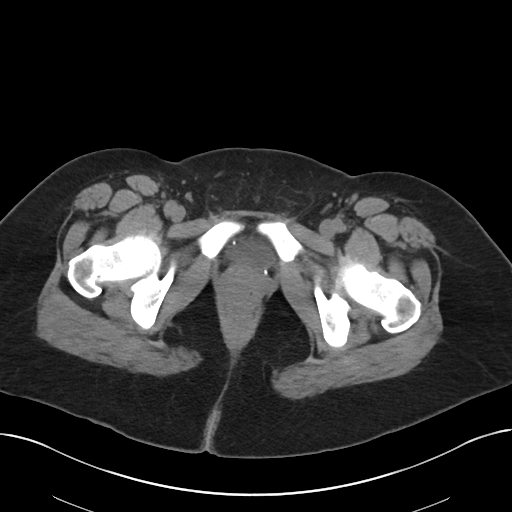
[im 23/102  soft-tissue]
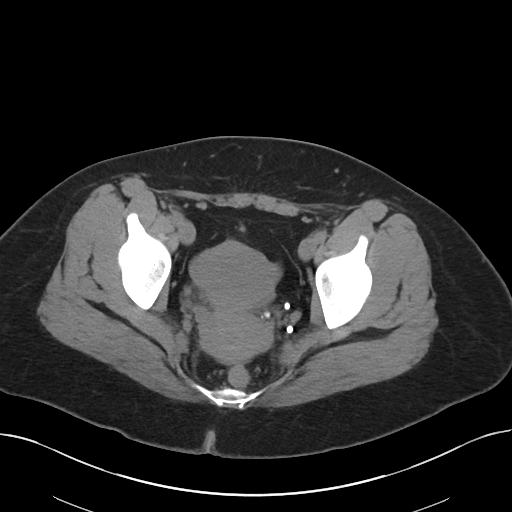
[im 28/102  soft-tissue]
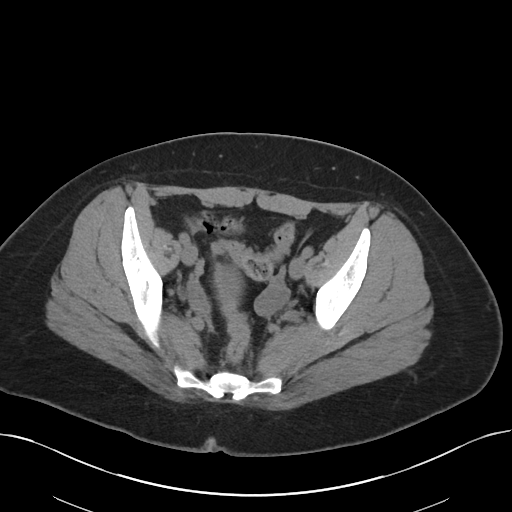
[im 37/102  soft-tissue]
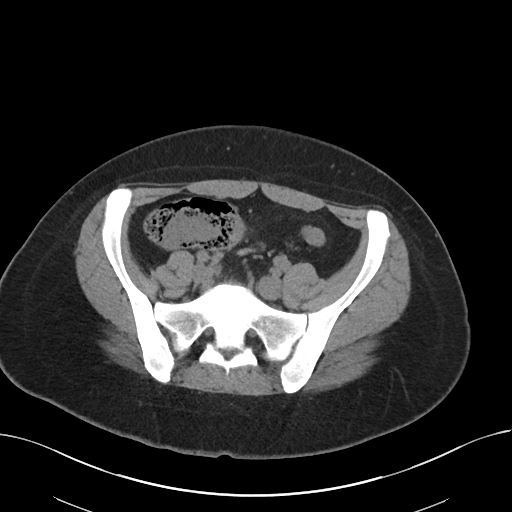
[im 42/102  soft-tissue]
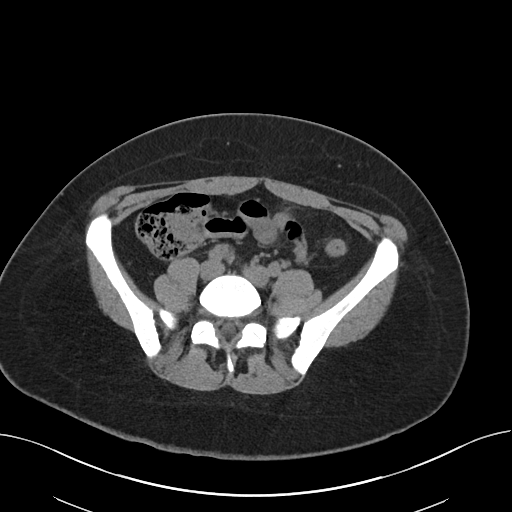
[im 51/102  soft-tissue]
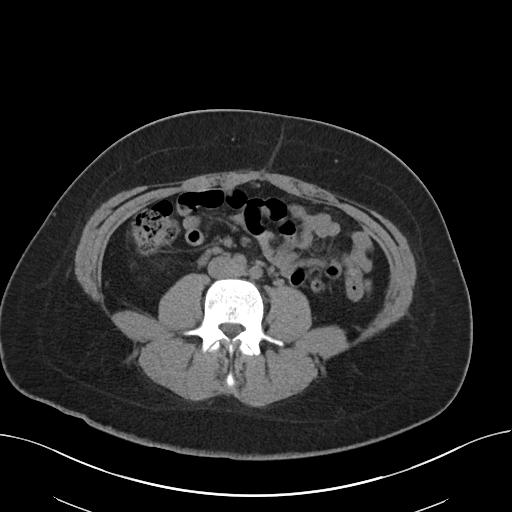
[im 60/102  soft-tissue]
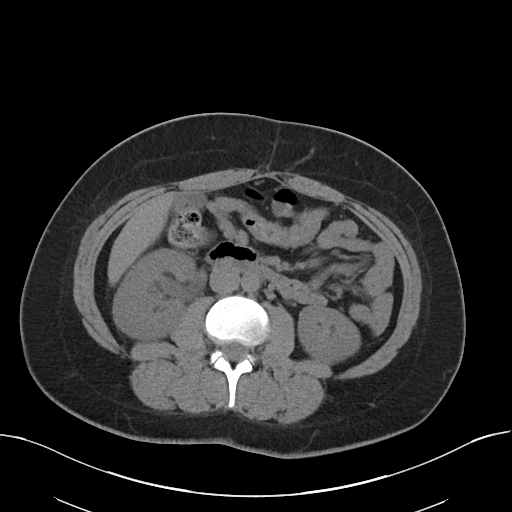
[im 65/102  soft-tissue]
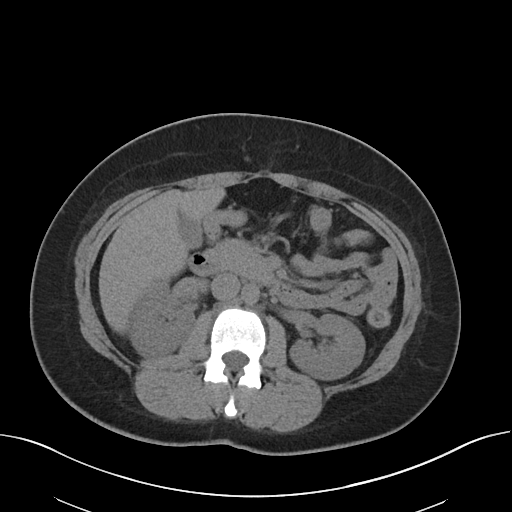
[im 65/102  bone]
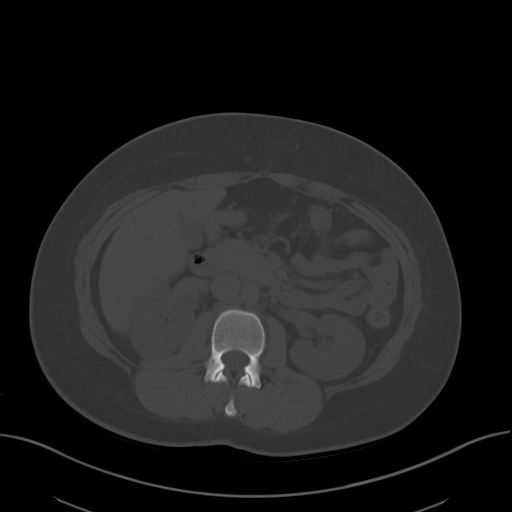
[im 74/102  soft-tissue]
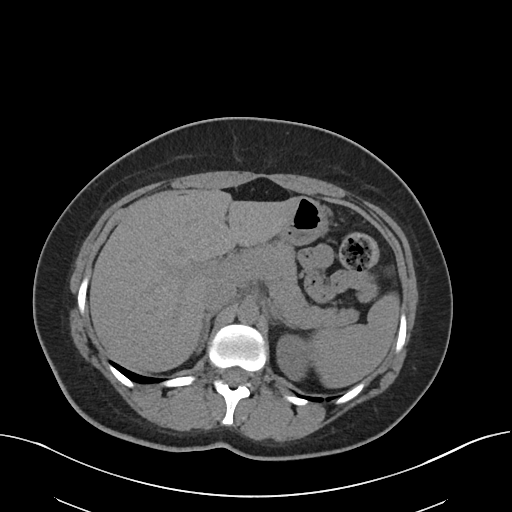
[im 79/102  soft-tissue]
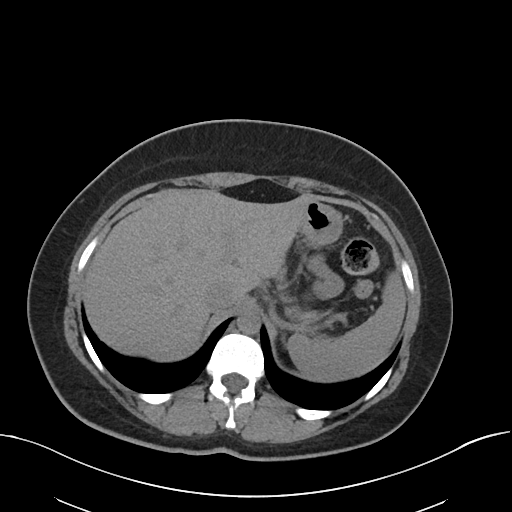
[im 88/102  soft-tissue]
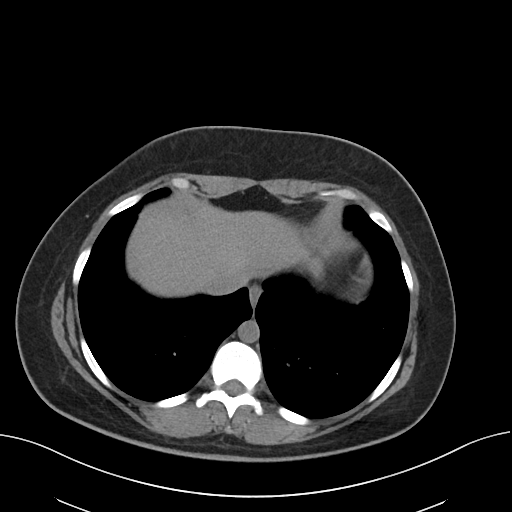
[im 97/102  soft-tissue]
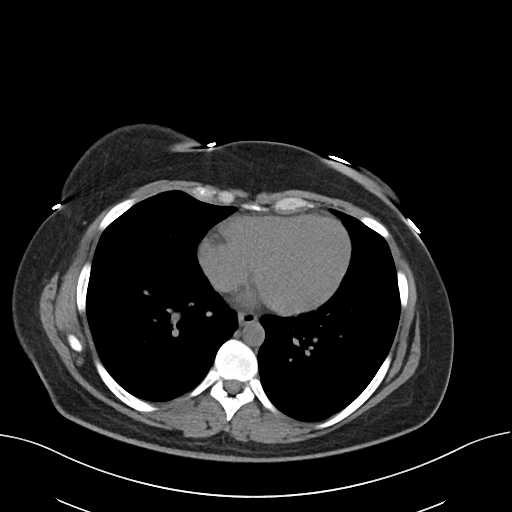

[Series 4: renal stone 3.0 cor · coronal · 0.74mm/px · 3 of 88 slices shown]
[im 30/88  soft-tissue]
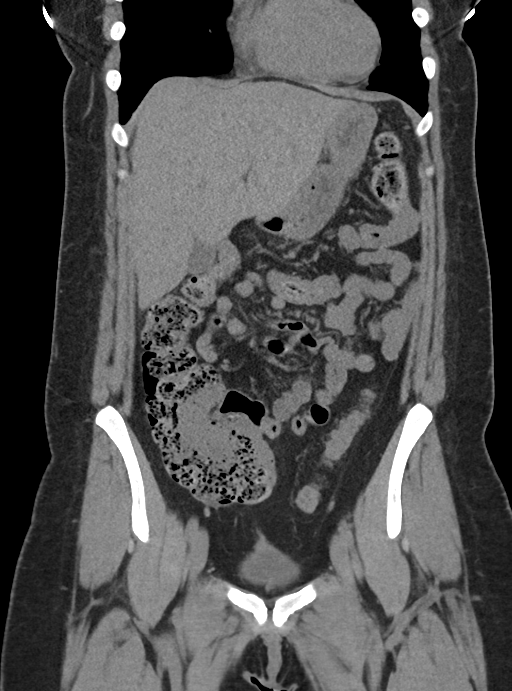
[im 39/88  soft-tissue]
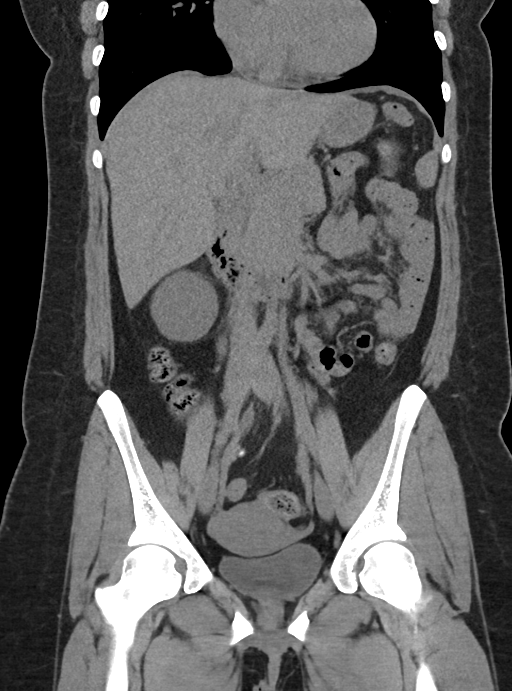
[im 49/88  soft-tissue]
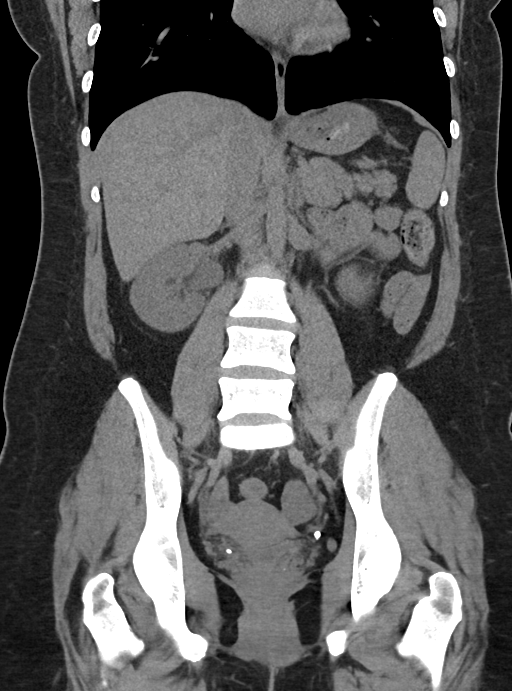

[16 of 46 positions shown; findings below may reference images not displayed]

FINDINGS: Evaluation of this exam is limited in the absence of intravenous
contrast.

The visualized lung bases are clear. No intra-abdominal free air or
free fluid.

The liver, gallbladder, pancreas, spleen, adrenal glands appear
unremarkable. The left kidney is unremarkable. There is a 4 mm
distal right ureteral stone with mild right hydronephrosis. A 3 mm
nonobstructing right renal inferior pole calculus is also noted. The
urinary bladder is grossly unremarkable. The uterus is anteverted.
The ovaries are grossly unremarkable.

Evaluation of the bowel is limited in the absence of oral contrast.
There is no evidence of bowel obstruction or active inflammation.
Small appendicolith noted within the appendix. The appendix is
otherwise unremarkable.

The abdominal aorta and IVC appear grossly unremarkable on this
noncontrast study. No portal venous gas identified. There is no
adenopathy. There is a midline vertical anterior abdominal wall
incisional scar. The abdominal wall soft tissues are otherwise
unremarkable. The osseous structures are intact.
IMPRESSION: A 4 mm distal right ureteral calculus with mild right
hydronephrosis. A 3 mm nonobstructing right renal inferior pole
calculus.

## 2016-07-31 DIAGNOSIS — Z3200 Encounter for pregnancy test, result unknown: Secondary | ICD-10-CM | POA: Diagnosis not present

## 2016-08-05 ENCOUNTER — Encounter: Payer: Self-pay | Admitting: Gynecology

## 2016-08-05 NOTE — Telephone Encounter (Signed)
A lot of good groups in town to include [facility], [facility], [facility], [facility]

## 2016-08-05 NOTE — Telephone Encounter (Signed)
This early in pregnancy I would hold on the medication so that we do not expose the embryo to any medications at this point. I would make an appointment with an obstetrician for ongoing obstetrical care and address this issue with them.

## 2016-08-06 DIAGNOSIS — Z3183 Encounter for assisted reproductive fertility procedure cycle: Secondary | ICD-10-CM | POA: Diagnosis not present

## 2016-08-06 DIAGNOSIS — Z113 Encounter for screening for infections with a predominantly sexual mode of transmission: Secondary | ICD-10-CM | POA: Diagnosis not present

## 2016-08-09 ENCOUNTER — Encounter: Payer: Self-pay | Admitting: Gynecology

## 2016-08-12 NOTE — Telephone Encounter (Signed)
Once an outbreak has established none of the medications appears to be significantly beneficial to shorten the course of the outbreak. Since she is having an outbreak I think the best thing would be to wait and it will run its course. I don't think medications either orally or topically are going to shorten the course of the outbreak at this time. I would recommend that she establish new OB appointment as soon as she can so that if this becomes a recurrent issue early in pregnancy they can help to direct her

## 2016-08-13 ENCOUNTER — Encounter (HOSPITAL_COMMUNITY): Payer: Self-pay | Admitting: *Deleted

## 2016-08-13 ENCOUNTER — Inpatient Hospital Stay (HOSPITAL_COMMUNITY): Payer: 59

## 2016-08-13 ENCOUNTER — Inpatient Hospital Stay (HOSPITAL_COMMUNITY)
Admission: AD | Admit: 2016-08-13 | Discharge: 2016-08-13 | Disposition: A | Discharge status: Home / Self Care | Payer: 59 | Source: Ambulatory Visit | Attending: Obstetrics & Gynecology | Admitting: Obstetrics & Gynecology

## 2016-08-13 DIAGNOSIS — O418X10 Other specified disorders of amniotic fluid and membranes, first trimester, not applicable or unspecified: Secondary | ICD-10-CM

## 2016-08-13 DIAGNOSIS — O4690 Antepartum hemorrhage, unspecified, unspecified trimester: Secondary | ICD-10-CM

## 2016-08-13 DIAGNOSIS — Z880 Allergy status to penicillin: Secondary | ICD-10-CM | POA: Diagnosis not present

## 2016-08-13 DIAGNOSIS — Z3A01 Less than 8 weeks gestation of pregnancy: Secondary | ICD-10-CM | POA: Insufficient documentation

## 2016-08-13 DIAGNOSIS — O208 Other hemorrhage in early pregnancy: Secondary | ICD-10-CM | POA: Diagnosis not present

## 2016-08-13 DIAGNOSIS — Z6790 Unspecified blood type, Rh positive: Secondary | ICD-10-CM | POA: Diagnosis not present

## 2016-08-13 DIAGNOSIS — O468X1 Other antepartum hemorrhage, first trimester: Secondary | ICD-10-CM | POA: Diagnosis not present

## 2016-08-13 DIAGNOSIS — Z3491 Encounter for supervision of normal pregnancy, unspecified, first trimester: Secondary | ICD-10-CM

## 2016-08-13 DIAGNOSIS — O209 Hemorrhage in early pregnancy, unspecified: Secondary | ICD-10-CM | POA: Insufficient documentation

## 2016-08-13 LAB — URINALYSIS, ROUTINE W REFLEX MICROSCOPIC
Bilirubin Urine: NEGATIVE
Glucose, UA: NEGATIVE mg/dL
Ketones, ur: NEGATIVE mg/dL
Leukocytes, UA: NEGATIVE
Nitrite: NEGATIVE
Protein, ur: NEGATIVE mg/dL
SPECIFIC GRAVITY, URINE: 1.02 (ref 1.005–1.030)
pH: 5.5 (ref 5.0–8.0)

## 2016-08-13 LAB — CBC
HEMATOCRIT: 38.8 % (ref 36.0–46.0)
HEMOGLOBIN: 13 g/dL (ref 12.0–15.0)
MCH: 30 pg (ref 26.0–34.0)
MCHC: 33.5 g/dL (ref 30.0–36.0)
MCV: 89.6 fL (ref 78.0–100.0)
Platelets: 268 10*3/uL (ref 150–400)
RBC: 4.33 MIL/uL (ref 3.87–5.11)
RDW: 13.3 % (ref 11.5–15.5)
WBC: 9.4 10*3/uL (ref 4.0–10.5)

## 2016-08-13 LAB — ABO/RH: ABO/RH(D): A POS

## 2016-08-13 LAB — HCG, QUANTITATIVE, PREGNANCY: hCG, Beta Chain, Quant, S: 128356 m[IU]/mL — ABNORMAL HIGH (ref <5)

## 2016-08-13 LAB — URINALYSIS, MICROSCOPIC (REFLEX): WBC UA: NONE SEEN WBC/hpf (ref 0–5)

## 2016-08-13 LAB — POCT PREGNANCY, URINE: PREG TEST UR: POSITIVE — AB

## 2016-08-13 NOTE — MAU Provider Note (Signed)
History     CSN: [id number]  Arrival date and time: 08/13/16 4098   First Provider Initiated Contact with Patient 08/13/16 [phone]      Chief Complaint  Patient presents with  . Vaginal Bleeding   G3P0020 @[redacted]w[redacted]d  here with VB. Describes bleeding as bright red and heavy as a menstrual cycle. Bleeding started around 0800. She passed a small clot. Denies pain or cramping. No recent IC. No fevers. No vaginal discharge prior.    OB History    Gravida Para Term Preterm AB Living   3       2 0   SAB TAB Ectopic Multiple Live Births     2            Past Medical History:  Diagnosis Date  . Cervical dysplasia 2004   no treatment, normal paps after  . HSV infection     Past Surgical History:  Procedure Laterality Date  . COLPOSCOPY    . ESOPHAGOGASTRODUODENOSCOPY      Family History  Problem Relation Age of Onset  . Heart disease Father   . Hypertension Maternal Grandmother     Social History  Substance Use Topics  . Smoking status: Never Smoker  . Smokeless tobacco: Never Used  . Alcohol use No    Allergies:  Allergies  Allergen Reactions  . Penicillins Swelling    REACTION: tongue swealling Has patient had a PCN reaction causing immediate rash, facial/tongue/throat swelling, SOB or lightheadedness with hypotension: yes Has patient had a PCN reaction causing severe rash involving mucus membranes or skin necrosis: no Has patient had a PCN reaction that required hospitalization: yes dr's office visit Has patient had a PCN reaction occurring within the last 10 years: no If all of the above answers are "NO", then may proceed with Cephalosporin use.   . Latex Itching and Rash    Redness and itching with latex exposure    Prescriptions Prior to Admission  Medication Sig Dispense Refill Last Dose  . Multiple Vitamins-Minerals (MULTIVITAMIN & MINERAL PO) Take 1 tablet by mouth daily.    08/12/2016 at Unknown time  . valACYclovir (VALTREX) 1000 MG tablet Take 1 tablet  (1,000 mg total) by mouth daily. 30 tablet 12     Review of Systems  Constitutional: Negative for fever.  Gastrointestinal: Negative for abdominal pain.  Genitourinary: Positive for vaginal bleeding. Negative for pelvic pain.   Physical Exam   Blood pressure 141/81, pulse 106, temperature 97.3 F (36.3 C), resp. rate 18, height 5\' 5"  (1.651 m), weight 93.4 kg (206 lb), last menstrual period 06/21/2016.  Physical Exam  Constitutional: She is oriented to person, place, and time. She appears well-developed and well-nourished. No distress.  HENT:  Head: Normocephalic and atraumatic.  Neck: Normal range of motion.  Respiratory: Effort normal. No respiratory distress.  GI: Soft. She exhibits no distension. There is no tenderness.  Genitourinary:  Genitourinary Comments: External: no lesions or erythema Vagina: rugated, nulli, small bloody discharge, small piece of ?POC removed from vagina Uterus: non enlarged, anteverted, non tender, no CMT, cervix closed    Musculoskeletal: Normal range of motion.  Neurological: She is alert and oriented to person, place, and time.  Skin: Skin is warm and dry.  Psychiatric: Her mood appears anxious (crying).   Results for orders placed or performed during the hospital encounter of 08/13/16 (from the past 24 hour(s))  Urinalysis, Routine w reflex microscopic     Status: Abnormal   Collection Time: 08/13/16  8:15  AM  Result Value Ref Range   Color, Urine AMBER (A) YELLOW   APPearance CLEAR CLEAR   Specific Gravity, Urine 1.020 1.005 - 1.030   pH 5.5 5.0 - 8.0   Glucose, UA NEGATIVE NEGATIVE mg/dL   Hgb urine dipstick LARGE (A) NEGATIVE   Bilirubin Urine NEGATIVE NEGATIVE   Ketones, ur NEGATIVE NEGATIVE mg/dL   Protein, ur NEGATIVE NEGATIVE mg/dL   Nitrite NEGATIVE NEGATIVE   Leukocytes, UA NEGATIVE NEGATIVE  Urinalysis, Microscopic (reflex)     Status: Abnormal   Collection Time: 08/13/16  8:15 AM  Result Value Ref Range   RBC / HPF TOO  NUMEROUS TO COUNT 0 - 5 RBC/hpf   WBC, UA NONE SEEN 0 - 5 WBC/hpf   Bacteria, UA RARE (A) NONE SEEN   Squamous Epithelial / LPF 0-5 (A) NONE SEEN  Pregnancy, urine POC     Status: Abnormal   Collection Time: 08/13/16  8:19 AM  Result Value Ref Range   Preg Test, Ur POSITIVE (A) NEGATIVE  CBC     Status: None   Collection Time: 08/13/16  9:41 AM  Result Value Ref Range   WBC 9.4 4.0 - 10.5 K/uL   RBC 4.33 3.87 - 5.11 MIL/uL   Hemoglobin 13.0 12.0 - 15.0 g/dL   HCT 16.1 09.6 - 04.5 %   MCV 89.6 78.0 - 100.0 fL   MCH 30.0 26.0 - 34.0 pg   MCHC 33.5 30.0 - 36.0 g/dL   RDW 40.9 81.1 - 91.4 %   Platelets 268 150 - 400 K/uL  ABO/Rh     Status: None (Preliminary result)   Collection Time: 08/13/16  9:41 AM  Result Value Ref Range   ABO/RH(D) A POS    US Ob Comp Less 14 Wks  Result Date: 08/13/2016 CLINICAL DATA:  Vaginal bleeding.  In vitro fertilization pregnancy EXAM: OBSTETRIC <14 WK [place] AND TRANSVAGINAL OB US TECHNIQUE: Both transabdominal and transvaginal ultrasound examinations were performed for complete evaluation of the gestation as well as the maternal uterus, adnexal regions, and pelvic cul-de-sac. Transvaginal technique was performed to assess early pregnancy. COMPARISON:  None. FINDINGS: Intrauterine gestational sac: Single Yolk sac:  Visualized Embryo:  Visualized Cardiac Activity: Visualized Heart Rate: 150  bpm MSD:   mm    w     d CRL:  13.4  mm   7 w   4 d                  [place] EDC: 03/28/2017 Subchorionic hemorrhage:  Small subchorionic hemorrhage Maternal uterus/adnexae: No adnexal masses or free fluid. IMPRESSION: Seven week 4 day intrauterine pregnancy. Fetal heart rate 150 beats per minute. Small subchorionic hemorrhage. Electronically Signed   By: Charlett Nose M.D.   On: 08/13/2016 09:29   US Ob Transvaginal  Result Date: 08/13/2016 CLINICAL DATA:  Vaginal bleeding.  In vitro fertilization pregnancy EXAM: OBSTETRIC <14 WK [place] AND TRANSVAGINAL OB US TECHNIQUE: Both  transabdominal and transvaginal ultrasound examinations were performed for complete evaluation of the gestation as well as the maternal uterus, adnexal regions, and pelvic cul-de-sac. Transvaginal technique was performed to assess early pregnancy. COMPARISON:  None. FINDINGS: Intrauterine gestational sac: Single Yolk sac:  Visualized Embryo:  Visualized Cardiac Activity: Visualized Heart Rate: 150  bpm MSD:   mm    w     d CRL:  13.4  mm   7 w   4 d  US EDC: 03/28/2017 Subchorionic hemorrhage:  Small subchorionic hemorrhage Maternal uterus/adnexae: No adnexal masses or free fluid. IMPRESSION: Seven week 4 day intrauterine pregnancy. Fetal heart rate 150 beats per minute. Small subchorionic hemorrhage. Electronically Signed   By: Charlett NoseKevin  Dover M.D.   On: 08/13/2016 09:29   MAU Course  Procedures  MDM Labs and US ordered and reviewed. Viable IUP on US, small SCH, likely source of VB. Stable for discharge home.  Assessment and Plan   1. [redacted] weeks gestation of pregnancy   2. Vaginal bleeding in pregnancy   3. Normal intrauterine pregnancy on prenatal ultrasound in first trimester   4. Subchorionic hemorrhage of placenta in first trimester, single or unspecified fetus   5. Blood type, Rh positive    Discharge home Follow up with OB provider of choice in 2-3 weeks SAB/bleeding precautions  Allergies as of 08/13/2016      Reactions   Penicillins Swelling   REACTION: tongue swealling Has patient had a PCN reaction causing immediate rash, facial/tongue/throat swelling, SOB or lightheadedness with hypotension: yes Has patient had a PCN reaction causing severe rash involving mucus membranes or skin necrosis: no Has patient had a PCN reaction that required hospitalization: yes dr's office visit Has patient had a PCN reaction occurring within the last 10 years: no If all of the above answers are "NO", then may proceed with Cephalosporin use.   Latex Itching, Rash   Redness and itching  with latex exposure      Medication List    STOP taking these medications   valACYclovir 1000 MG tablet Commonly known as:  VALTREX     TAKE these medications   MULTIVITAMIN & MINERAL PO Take 1 tablet by mouth daily.      Donette LarryMelanie Mateja Dier, CNM 08/13/2016, 8:43 AM

## 2016-08-13 NOTE — MAU Note (Addendum)
PT presents to MAU with complaints of vaginal bleeding that started this morning. PT states she had IVF on January the 31st. Denies pain

## 2016-08-13 NOTE — Discharge Instructions (Signed)
Subchorionic Hematoma A subchorionic hematoma is a gathering of blood between the outer wall of the placenta and the inner wall of the womb (uterus). The placenta is the organ that connects the fetus to the wall of the uterus. The placenta performs the feeding, breathing (oxygen to the fetus), and waste removal (excretory work) of the fetus. Subchorionic hematoma is the most common abnormality found on a result from ultrasonography done during the first trimester or early second trimester of pregnancy. If there has been little or no vaginal bleeding, early small hematomas usually shrink on their own and do not affect your baby or pregnancy. The blood is gradually absorbed over 1-2 weeks. When bleeding starts later in pregnancy or the hematoma is larger or occurs in an older pregnant woman, the outcome may not be as good. Larger hematomas may get bigger, which increases the chances for miscarriage. Subchorionic hematoma also increases the risk of premature detachment of the placenta from the uterus, preterm (premature) labor, and stillbirth. Follow these instructions at home:  Stay on bed rest if your health care provider recommends this. Although bed rest will not prevent more bleeding or prevent a miscarriage, your health care provider may recommend bed rest until you are advised otherwise.  Avoid heavy lifting (more than 10 lb [4.5 kg]), exercise, sexual intercourse, or douching as directed by your health care provider.  Keep track of the number of pads you use each day and how soaked (saturated) they are. Write down this information.  Do not use tampons.  Keep all follow-up appointments as directed by your health care provider. Your health care provider may ask you to have follow-up blood tests or ultrasound tests or both. Get help right away if:  You have severe cramps in your stomach, back, abdomen, or pelvis.  You have a fever.  You pass large clots or tissue. Save any tissue for your  health care provider to look at.  Your bleeding increases or you become lightheaded, feel weak, or have fainting episodes. This information is not intended to replace advice given to you by your health care provider. Make sure you discuss any questions you have with your health care provider. Document Released: 09/11/2006 Document Revised: 11/02/2015 Document Reviewed: 12/24/2012 Elsevier Interactive Patient Education  2017 Elsevier Inc. Vaginal Bleeding During Pregnancy, First Trimester A small amount of bleeding (spotting) from the vagina is common in early pregnancy. Sometimes the bleeding is normal and is not a problem, and sometimes it is a sign of something serious. Be sure to tell your doctor about any bleeding from your vagina right away. Follow these instructions at home:  Watch your condition for any changes.  Follow your doctor's instructions about how active you can be.  If you are on bed rest:  You may need to stay in bed and only get up to use the bathroom.  You may be allowed to do some activities.  If you need help, make plans for someone to help you.  Write down:  The number of pads you use each day.  How often you change pads.  How soaked (saturated) your pads are.  Do not use tampons.  Do not douche.  Do not have sex or orgasms until your doctor says it is okay.  If you pass any tissue from your vagina, save the tissue so you can show it to your doctor.  Only take medicines as told by your doctor.  Do not take aspirin because it can make you bleed.  Keep all follow-up visits as told by your doctor. Contact a doctor if:  You bleed from your vagina.  You have cramps.  You have labor pains.  You have a fever that does not go away after you take medicine. Get help right away if:  You have very bad cramps in your back or belly (abdomen).  You pass large clots or tissue from your vagina.  You bleed more.  You feel light-headed or weak.  You  pass out (faint).  You have chills.  You are leaking fluid or have a gush of fluid from your vagina.  You pass out while pooping (having a bowel movement). This information is not intended to replace advice given to you by your health care provider. Make sure you discuss any questions you have with your health care provider. Document Released: 10/11/2013 Document Revised: 11/02/2015 Document Reviewed: 02/01/2013 Elsevier Interactive Patient Education  2017 [employer]

## 2016-08-28 DIAGNOSIS — O0900 Supervision of pregnancy with history of infertility, unspecified trimester: Secondary | ICD-10-CM | POA: Diagnosis not present

## 2016-09-04 LAB — OB RESULTS CONSOLE RPR: RPR: NONREACTIVE

## 2016-09-04 LAB — OB RESULTS CONSOLE ABO/RH: RH Type: POSITIVE

## 2016-09-04 LAB — OB RESULTS CONSOLE GC/CHLAMYDIA
CHLAMYDIA, DNA PROBE: NEGATIVE
GC PROBE AMP, GENITAL: NEGATIVE

## 2016-09-04 LAB — OB RESULTS CONSOLE HEPATITIS B SURFACE ANTIGEN: Hepatitis B Surface Ag: NEGATIVE

## 2016-09-04 LAB — OB RESULTS CONSOLE HIV ANTIBODY (ROUTINE TESTING): HIV: NONREACTIVE

## 2016-09-04 LAB — OB RESULTS CONSOLE RUBELLA ANTIBODY, IGM: Rubella: IMMUNE

## 2016-09-04 LAB — OB RESULTS CONSOLE ANTIBODY SCREEN: ANTIBODY SCREEN: NEGATIVE

## 2016-09-05 DIAGNOSIS — Z113 Encounter for screening for infections with a predominantly sexual mode of transmission: Secondary | ICD-10-CM | POA: Diagnosis not present

## 2016-09-05 DIAGNOSIS — Z3689 Encounter for other specified antenatal screening: Secondary | ICD-10-CM | POA: Diagnosis not present

## 2016-09-05 DIAGNOSIS — O09811 Supervision of pregnancy resulting from assisted reproductive technology, first trimester: Secondary | ICD-10-CM | POA: Diagnosis not present

## 2016-09-25 DIAGNOSIS — Z3682 Encounter for antenatal screening for nuchal translucency: Secondary | ICD-10-CM | POA: Diagnosis not present

## 2016-09-25 DIAGNOSIS — Z3A13 13 weeks gestation of pregnancy: Secondary | ICD-10-CM | POA: Diagnosis not present

## 2016-09-25 DIAGNOSIS — O09811 Supervision of pregnancy resulting from assisted reproductive technology, first trimester: Secondary | ICD-10-CM | POA: Diagnosis not present

## 2016-10-15 DIAGNOSIS — Z361 Encounter for antenatal screening for raised alphafetoprotein level: Secondary | ICD-10-CM | POA: Diagnosis not present

## 2016-11-06 DIAGNOSIS — Z3A19 19 weeks gestation of pregnancy: Secondary | ICD-10-CM | POA: Diagnosis not present

## 2016-11-06 DIAGNOSIS — O09512 Supervision of elderly primigravida, second trimester: Secondary | ICD-10-CM | POA: Diagnosis not present

## 2016-11-19 DIAGNOSIS — O09812 Supervision of pregnancy resulting from assisted reproductive technology, second trimester: Secondary | ICD-10-CM | POA: Diagnosis not present

## 2016-11-19 DIAGNOSIS — Z3A21 21 weeks gestation of pregnancy: Secondary | ICD-10-CM | POA: Diagnosis not present

## 2016-11-19 DIAGNOSIS — O09512 Supervision of elderly primigravida, second trimester: Secondary | ICD-10-CM | POA: Diagnosis not present

## 2017-01-07 DIAGNOSIS — Z23 Encounter for immunization: Secondary | ICD-10-CM | POA: Diagnosis not present

## 2017-01-07 DIAGNOSIS — Z3A28 28 weeks gestation of pregnancy: Secondary | ICD-10-CM | POA: Diagnosis not present

## 2017-01-07 DIAGNOSIS — Z3689 Encounter for other specified antenatal screening: Secondary | ICD-10-CM | POA: Diagnosis not present

## 2017-01-07 DIAGNOSIS — O09812 Supervision of pregnancy resulting from assisted reproductive technology, second trimester: Secondary | ICD-10-CM | POA: Diagnosis not present

## 2017-01-17 DIAGNOSIS — O09813 Supervision of pregnancy resulting from assisted reproductive technology, third trimester: Secondary | ICD-10-CM | POA: Diagnosis not present

## 2017-01-17 DIAGNOSIS — O09513 Supervision of elderly primigravida, third trimester: Secondary | ICD-10-CM | POA: Diagnosis not present

## 2017-01-17 DIAGNOSIS — Z3A30 30 weeks gestation of pregnancy: Secondary | ICD-10-CM | POA: Diagnosis not present

## 2017-01-28 DIAGNOSIS — Z3A31 31 weeks gestation of pregnancy: Secondary | ICD-10-CM | POA: Diagnosis not present

## 2017-01-28 DIAGNOSIS — O4703 False labor before 37 completed weeks of gestation, third trimester: Secondary | ICD-10-CM | POA: Diagnosis not present

## 2017-02-13 MED FILL — VALACYCLOVIR HCL 500 MG TAB: 500 | 30 days supply | Qty: 40 | Fill #0

## 2017-02-27 DIAGNOSIS — O133 Gestational [pregnancy-induced] hypertension without significant proteinuria, third trimester: Secondary | ICD-10-CM | POA: Diagnosis not present

## 2017-02-27 DIAGNOSIS — Z3A35 35 weeks gestation of pregnancy: Secondary | ICD-10-CM | POA: Diagnosis not present

## 2017-02-27 DIAGNOSIS — Z3685 Encounter for antenatal screening for Streptococcus B: Secondary | ICD-10-CM | POA: Diagnosis not present

## 2017-02-27 DIAGNOSIS — O09813 Supervision of pregnancy resulting from assisted reproductive technology, third trimester: Secondary | ICD-10-CM | POA: Diagnosis not present

## 2017-02-27 LAB — OB RESULTS CONSOLE GBS: STREP GROUP B AG: NEGATIVE

## 2017-03-03 DIAGNOSIS — O133 Gestational [pregnancy-induced] hypertension without significant proteinuria, third trimester: Secondary | ICD-10-CM | POA: Diagnosis not present

## 2017-03-03 DIAGNOSIS — Z3A36 36 weeks gestation of pregnancy: Secondary | ICD-10-CM | POA: Diagnosis not present

## 2017-03-06 DIAGNOSIS — O133 Gestational [pregnancy-induced] hypertension without significant proteinuria, third trimester: Secondary | ICD-10-CM | POA: Diagnosis not present

## 2017-03-06 DIAGNOSIS — Z3A36 36 weeks gestation of pregnancy: Secondary | ICD-10-CM | POA: Diagnosis not present

## 2017-03-10 ENCOUNTER — Encounter: Payer: 59 | Admitting: Gynecology

## 2017-03-11 ENCOUNTER — Other Ambulatory Visit: Payer: Self-pay | Admitting: Obstetrics & Gynecology

## 2017-03-12 ENCOUNTER — Encounter (HOSPITAL_COMMUNITY): Payer: Self-pay | Admitting: *Deleted

## 2017-03-12 ENCOUNTER — Inpatient Hospital Stay (HOSPITAL_COMMUNITY)
Admission: AD | Admit: 2017-03-12 | Discharge: 2017-03-12 | Disposition: A | Discharge status: Home / Self Care | Payer: 59 | Source: Ambulatory Visit | Attending: Obstetrics and Gynecology | Admitting: Obstetrics and Gynecology

## 2017-03-12 DIAGNOSIS — Z3A37 37 weeks gestation of pregnancy: Secondary | ICD-10-CM | POA: Insufficient documentation

## 2017-03-12 DIAGNOSIS — Z880 Allergy status to penicillin: Secondary | ICD-10-CM | POA: Insufficient documentation

## 2017-03-12 DIAGNOSIS — O133 Gestational [pregnancy-induced] hypertension without significant proteinuria, third trimester: Secondary | ICD-10-CM | POA: Insufficient documentation

## 2017-03-12 DIAGNOSIS — Z23 Encounter for immunization: Secondary | ICD-10-CM | POA: Diagnosis not present

## 2017-03-12 DIAGNOSIS — R030 Elevated blood-pressure reading, without diagnosis of hypertension: Secondary | ICD-10-CM | POA: Diagnosis not present

## 2017-03-12 HISTORY — DX: Other specified health status: Z78.9

## 2017-03-12 LAB — COMPREHENSIVE METABOLIC PANEL
ALT: 16 U/L (ref 14–54)
AST: 19 U/L (ref 15–41)
Albumin: 3 g/dL — ABNORMAL LOW (ref 3.5–5.0)
Alkaline Phosphatase: 145 U/L — ABNORMAL HIGH (ref 38–126)
Anion gap: 7 (ref 5–15)
BUN: 8 mg/dL (ref 6–20)
CHLORIDE: 105 mmol/L (ref 101–111)
CO2: 23 mmol/L (ref 22–32)
CREATININE: 0.76 mg/dL (ref 0.44–1.00)
Calcium: 8.9 mg/dL (ref 8.9–10.3)
GFR calc non Af Amer: >60 mL/min (ref >60)
Glucose, Bld: 73 mg/dL (ref 65–99)
Potassium: 4.1 mmol/L (ref 3.5–5.1)
SODIUM: 135 mmol/L (ref 135–145)
Total Bilirubin: 0.4 mg/dL (ref 0.3–1.2)
Total Protein: 7 g/dL (ref 6.5–8.1)

## 2017-03-12 LAB — CBC
HCT: 36.2 % (ref 36.0–46.0)
Hemoglobin: 12.1 g/dL (ref 12.0–15.0)
MCH: 29.4 pg (ref 26.0–34.0)
MCHC: 33.4 g/dL (ref 30.0–36.0)
MCV: 88.1 fL (ref 78.0–100.0)
PLATELETS: 204 10*3/uL (ref 150–400)
RBC: 4.11 MIL/uL (ref 3.87–5.11)
RDW: 14 % (ref 11.5–15.5)
WBC: 7.9 10*3/uL (ref 4.0–10.5)

## 2017-03-12 LAB — PROTEIN / CREATININE RATIO, URINE
CREATININE, URINE: 78 mg/dL
PROTEIN CREATININE RATIO: 0.18 mg/mg{creat} — AB (ref 0.00–0.15)
TOTAL PROTEIN, URINE: 14 mg/dL

## 2017-03-12 LAB — LACTATE DEHYDROGENASE: LDH: 136 U/L (ref 98–192)

## 2017-03-12 LAB — URIC ACID: Uric Acid, Serum: 3.7 mg/dL (ref 2.3–6.6)

## 2017-03-12 NOTE — Discharge Instructions (Signed)
Hypertension During Pregnancy °Hypertension, commonly called high blood pressure, is when the force of blood pumping through your arteries is too strong. Arteries are blood vessels that carry blood from the heart throughout the body. Hypertension during pregnancy can cause problems for you and your baby. Your baby may be born early (prematurely) or may not weigh as much as he or she should at birth. Very bad cases of hypertension during pregnancy can be life-threatening. °Different types of hypertension can occur during pregnancy. These include: °· Chronic hypertension. This happens when: °? You have hypertension before pregnancy and it continues during pregnancy. °? You develop hypertension before you are [redacted] weeks pregnant, and it continues during pregnancy. °· Gestational hypertension. This is hypertension that develops after the 20th week of pregnancy. °· Preeclampsia, also called toxemia of pregnancy. This is a very serious type of hypertension that develops only during pregnancy. It affects the whole body, and it can be very dangerous for you and your baby. ° °Gestational hypertension and preeclampsia usually go away within 6 weeks after your baby is born. Women who have hypertension during pregnancy have a greater chance of developing hypertension later in life or during future pregnancies. °What are the causes? °The exact cause of hypertension is not known. °What increases the risk? °There are certain factors that make it more likely for you to develop hypertension during pregnancy. These include: °· Having hypertension during a previous pregnancy or prior to pregnancy. °· Being overweight. °· Being older than age 40. °· Being pregnant for the first time or being pregnant with more than one baby. °· Becoming pregnant using fertilization methods such as IVF (in vitro fertilization). °· Having diabetes, kidney problems, or systemic lupus erythematosus. °· Having a family history of hypertension. ° °What are the  signs or symptoms? °Chronic hypertension and gestational hypertension rarely cause symptoms. Preeclampsia causes symptoms, which may include: °· Increased protein in your urine. Your health care provider will check for this at every visit before you give birth (prenatal visit). °· Severe headaches. °· Sudden weight gain. °· Swelling of the hands, face, legs, and feet. °· Nausea and vomiting. °· Vision problems, such as blurred or double vision. °· Numbness in the face, arms, legs, and feet. °· Dizziness. °· Slurred speech. °· Sensitivity to bright lights. °· Abdominal pain. °· Convulsions. ° °How is this diagnosed? °You may be diagnosed with hypertension during a routine prenatal exam. At each prenatal visit, you may: °· Have a urine test to check for high amounts of protein in your urine. °· Have your blood pressure checked. A blood pressure reading is recorded as two numbers, such as "120 over 80" (or 120/80). The first ("top") number is called the systolic pressure. It is a measure of the pressure in your arteries when your heart beats. The second ("bottom") number is called the diastolic pressure. It is a measure of the pressure in your arteries as your heart relaxes between beats. Blood pressure is measured in a unit called mm Hg. A normal blood pressure reading is: °? Systolic: below 120. °? Diastolic: below 80. ° °The type of hypertension that you are diagnosed with depends on your test results and when your symptoms developed. °· Chronic hypertension is usually diagnosed before 20 weeks of pregnancy. °· Gestational hypertension is usually diagnosed after 20 weeks of pregnancy. °· Hypertension with high amounts of protein in the urine is diagnosed as preeclampsia. °· Blood pressure measurements that stay above 160 systolic, or above 110 diastolic, are   signs of severe preeclampsia. ° °How is this treated? °Treatment for hypertension during pregnancy varies depending on the type of hypertension you have and how  serious it is. °· If you take medicines called ACE inhibitors to treat chronic hypertension, you may need to switch medicines. ACE inhibitors should not be taken during pregnancy. °· If you have gestational hypertension, you may need to take blood pressure medicine. °· If you are at risk for preeclampsia, your health care provider may recommend that you take a low-dose aspirin every day to prevent high blood pressure during your pregnancy. °· If you have severe preeclampsia, you may need to be hospitalized so you and your baby can be monitored closely. You may also need to take medicine (magnesium sulfate) to prevent seizures and to lower blood pressure. This medicine may be given as an injection or through an IV tube. °· In some cases, if your condition gets worse, you may need to deliver your baby early. ° °Follow these instructions at home: °Eating and drinking °· Drink enough fluid to keep your urine clear or pale yellow. °· Eat a healthy diet that is low in salt (sodium). Do not add salt to your food. Check food labels to see how much sodium a food or beverage contains. °Lifestyle °· Do not use any products that contain nicotine or tobacco, such as cigarettes and e-cigarettes. If you need help quitting, ask your health care provider. °· Do not use alcohol. °· Avoid caffeine. °· Avoid stress as much as possible. Rest and get plenty of sleep. °General instructions °· Take over-the-counter and prescription medicines only as told by your health care provider. °· While lying down, lie on your left side. This keeps pressure off your baby. °· While sitting or lying down, raise (elevate) your feet. Try putting some pillows under your lower legs. °· Exercise regularly. Ask your health care provider what kinds of exercise are best for you. °· Keep all prenatal and follow-up visits as told by your health care provider. This is important. °Contact a health care provider if: °· You have symptoms that your health care  provider told you may require more treatment or monitoring, such as: °? Fever. °? Vomiting. °? Headache. °Get help right away if: °· You have severe abdominal pain or vomiting that does not get better with treatment. °· You suddenly develop swelling in your hands, ankles, or face. °· You gain 4 lbs (1.8 kg) or more in 1 week. °· You develop vaginal bleeding, or you have blood in your urine. °· You do not feel your baby moving as much as usual. °· You have blurred or double vision. °· You have muscle twitching or sudden tightening (spasms). °· You have shortness of breath. °· Your lips or fingernails turn blue. °This information is not intended to replace advice given to you by your health care provider. Make sure you discuss any questions you have with your health care provider. °Document Released: 02/12/2011 Document Revised: 12/15/2015 Document Reviewed: 11/10/2015 °Elsevier Interactive Patient Education © 2018 Elsevier Inc. ° °

## 2017-03-12 NOTE — MAU Note (Signed)
BP was elevated, gradual over past wk.  Denies HA, visual changes, or epigastric pain.  Some swelling, but is unchanged.

## 2017-03-12 NOTE — MAU Provider Note (Signed)
History     CSN: [id number]  Arrival date and time: 03/12/17 1724   First Provider Initiated Contact with Patient 03/12/17 1825      Chief Complaint  Patient presents with  . Hypertension   HPI   Ms.Teresa Harvey is a 36 y.o. female G3P0020 @ 108w5dhere in MAU with elevated BP. The elevated BP's started yesterday. She was seen in the office today and her BP was high. States her BP was  High yesterday at home and this is the first time in the pregnancy that her BP has been elevated.  She has not taken any medication in this pregnancy for high blood pressure. Denies HA, changes in vision, or abdominal pain. + fetal movement. No bleeding.   OB History    Gravida Para Term Preterm AB Living   3       2 0   SAB TAB Ectopic Multiple Live Births     2            Past Medical History:  Diagnosis Date  . Cervical dysplasia 2004   no treatment, normal paps after  . HSV infection   . Medical history non-contributory     Past Surgical History:  Procedure Laterality Date  . COLPOSCOPY    . ESOPHAGOGASTRODUODENOSCOPY      Family History  Problem Relation Age of Onset  . Heart disease Father   . Hypertension Maternal Grandmother     Social History  Substance Use Topics  . Smoking status: Never Smoker  . Smokeless tobacco: Never Used  . Alcohol use No    Allergies:  Allergies  Allergen Reactions  . Penicillins Swelling    REACTION: tongue swealling Has patient had a PCN reaction causing immediate rash, facial/tongue/throat swelling, SOB or lightheadedness with hypotension: yes Has patient had a PCN reaction causing severe rash involving mucus membranes or skin necrosis: no Has patient had a PCN reaction that required hospitalization: yes dr's office visit Has patient had a PCN reaction occurring within the last 10 years: no If all of the above answers are "NO", then may proceed with Cephalosporin use.   . Latex Itching and Rash    Redness and itching with latex  exposure    Prescriptions Prior to Admission  Medication Sig Dispense Refill Last Dose  . Multiple Vitamins-Minerals (MULTIVITAMIN & MINERAL PO) Take 1 tablet by mouth daily.    08/12/2016 at Unknown time   Recent Results (from the past 2160 hour(s))  Protein / creatinine ratio, urine     Status: Abnormal   Collection Time: 03/12/17  5:39 PM  Result Value Ref Range   Creatinine, Urine 78.00 mg/dL   Total Protein, Urine 14 mg/dL    Comment: NO NORMAL RANGE ESTABLISHED FOR THIS TEST   Protein Creatinine Ratio 0.18 (H) 0.00 - 0.15 mg/mg[Cre]  CBC     Status: None   Collection Time: 03/12/17  6:38 PM  Result Value Ref Range   WBC 7.9 4.0 - 10.5 K/uL   RBC 4.11 3.87 - 5.11 MIL/uL   Hemoglobin 12.1 12.0 - 15.0 g/dL   HCT 36.2 36.0 - 46.0 %   MCV 88.1 78.0 - 100.0 fL   MCH 29.4 26.0 - 34.0 pg   MCHC 33.4 30.0 - 36.0 g/dL   RDW 14.0 11.5 - 15.5 %   Platelets 204 150 - 400 K/uL  Comprehensive metabolic panel     Status: Abnormal   Collection Time: 03/12/17  6:38 PM  Result  Value Ref Range   Sodium 135 135 - 145 mmol/L   Potassium 4.1 3.5 - 5.1 mmol/L   Chloride 105 101 - 111 mmol/L   CO2 23 22 - 32 mmol/L   Glucose, Bld 73 65 - 99 mg/dL   BUN 8 6 - 20 mg/dL   Creatinine, Ser 0.76 0.44 - 1.00 mg/dL   Calcium 8.9 8.9 - 10.3 mg/dL   Total Protein 7.0 6.5 - 8.1 g/dL   Albumin 3.0 (L) 3.5 - 5.0 g/dL   AST 19 15 - 41 U/L   ALT 16 14 - 54 U/L   Alkaline Phosphatase 145 (H) 38 - 126 U/L   Total Bilirubin 0.4 0.3 - 1.2 mg/dL   GFR calc non Af Amer >60 >60 mL/min   GFR calc Af Amer >60 >60 mL/min    Comment: (NOTE) The eGFR has been calculated using the CKD EPI equation. This calculation has not been validated in all clinical situations. eGFR's persistently <60 mL/min signify possible Chronic Kidney Disease.    Anion gap 7 5 - 15  Uric acid     Status: None   Collection Time: 03/12/17  6:38 PM  Result Value Ref Range   Uric Acid, Serum 3.7 2.3 - 6.6 mg/dL  Lactate dehydrogenase      Status: None   Collection Time: 03/12/17  6:38 PM  Result Value Ref Range   LDH 136 98 - 192 U/L   Review of Systems  Constitutional: Negative for fever.  Eyes: Negative for photophobia and visual disturbance.  Gastrointestinal: Negative for abdominal pain.  Genitourinary: Negative for vaginal bleeding, vaginal discharge and vaginal pain.  Neurological: Negative for dizziness.   Physical Exam   Blood pressure 132/87, pulse 80, temperature 98.3 F (36.8 C), temperature source Oral, resp. rate 16, weight 244 lb (110.7 kg), last menstrual period 06/21/2016, SpO2 100 %.  Patient Vitals for the past 24 hrs:  BP Temp Temp src Pulse Resp SpO2 Weight  03/12/17 1900 131/83 - - 78 - - -  03/12/17 1816 132/87 - - 80 - - -  03/12/17 1801 (!) 139/91 - - 83 - - -  03/12/17 1756 (!) 145/91 - - 81 - - -  03/12/17 1737 (!) 136/93 98.3 F (36.8 C) Oral 86 16 100 % 244 lb (110.7 kg)     Physical Exam  Constitutional: She is oriented to person, place, and time. She appears well-developed and well-nourished. No distress.  HENT:  Head: Normocephalic.  Eyes: Pupils are equal, round, and reactive to light.  Cardiovascular: Normal rate and normal heart sounds.   Respiratory: Effort normal and breath sounds normal.  GI: Soft. She exhibits no distension. There is no tenderness. There is no rebound.  Musculoskeletal: Normal range of motion.  Neurological: She is alert and oriented to person, place, and time. She has normal reflexes. She displays normal reflexes.  Negative clonus   Skin: Skin is warm. She is not diaphoretic.  Psychiatric: Her behavior is normal.   Fetal Tracing: Baseline: 125 bpm Variability: Moderate  Accelerations: 15x15 Decelerations: None Toco: occasional   MAU Course  Procedures  None  MDM  PIH labs & PCR Serial BP readings  Consulted with Dr. Ronita Hipps, patient meets criteria for gestational HTN> Dr. Ronita Hipps recommends follow up in office in 48 hours.   Assessment  and Plan   A:  1. Gestational hypertension without significant proteinuria in third trimester     P:  Discharge home with strict preeclampsia precautions with  strict return precautions Follow up with Dr. Ronita Hipps in 2 days for BP check Preeclampsia signs and symptoms reviewed   Geoge Lawrance, Artist Pais, NP 03/15/2017 8:19 AM

## 2017-03-14 ENCOUNTER — Other Ambulatory Visit: Payer: Self-pay | Admitting: Obstetrics and Gynecology

## 2017-03-14 DIAGNOSIS — O133 Gestational [pregnancy-induced] hypertension without significant proteinuria, third trimester: Secondary | ICD-10-CM | POA: Diagnosis not present

## 2017-03-14 DIAGNOSIS — Z3A38 38 weeks gestation of pregnancy: Secondary | ICD-10-CM | POA: Diagnosis not present

## 2017-03-17 ENCOUNTER — Encounter (HOSPITAL_COMMUNITY): Payer: Self-pay

## 2017-03-18 DIAGNOSIS — Z3A38 38 weeks gestation of pregnancy: Secondary | ICD-10-CM | POA: Diagnosis not present

## 2017-03-18 DIAGNOSIS — O133 Gestational [pregnancy-induced] hypertension without significant proteinuria, third trimester: Secondary | ICD-10-CM | POA: Diagnosis not present

## 2017-03-19 ENCOUNTER — Encounter (HOSPITAL_COMMUNITY): Payer: Self-pay

## 2017-03-20 ENCOUNTER — Encounter (HOSPITAL_COMMUNITY)
Admission: RE | Admit: 2017-03-20 | Discharge: 2017-03-20 | Disposition: A | Discharge status: Home / Self Care | Payer: 59 | Source: Ambulatory Visit | Attending: Obstetrics & Gynecology | Admitting: Obstetrics & Gynecology

## 2017-03-20 DIAGNOSIS — O9962 Diseases of the digestive system complicating childbirth: Secondary | ICD-10-CM | POA: Diagnosis not present

## 2017-03-20 DIAGNOSIS — K219 Gastro-esophageal reflux disease without esophagitis: Secondary | ICD-10-CM | POA: Diagnosis not present

## 2017-03-20 DIAGNOSIS — Z880 Allergy status to penicillin: Secondary | ICD-10-CM | POA: Diagnosis not present

## 2017-03-20 DIAGNOSIS — O134 Gestational [pregnancy-induced] hypertension without significant proteinuria, complicating childbirth: Secondary | ICD-10-CM | POA: Diagnosis not present

## 2017-03-20 DIAGNOSIS — Z3A39 39 weeks gestation of pregnancy: Secondary | ICD-10-CM | POA: Diagnosis not present

## 2017-03-20 DIAGNOSIS — A6000 Herpesviral infection of urogenital system, unspecified: Secondary | ICD-10-CM | POA: Diagnosis not present

## 2017-03-20 DIAGNOSIS — Z87410 Personal history of cervical dysplasia: Secondary | ICD-10-CM | POA: Diagnosis not present

## 2017-03-20 DIAGNOSIS — O9832 Other infections with a predominantly sexual mode of transmission complicating childbirth: Secondary | ICD-10-CM | POA: Diagnosis not present

## 2017-03-20 HISTORY — DX: Unspecified abnormal cytological findings in specimens from vagina: R87.629

## 2017-03-20 LAB — CBC
HCT: 38.2 % (ref 36.0–46.0)
Hemoglobin: 12.6 g/dL (ref 12.0–15.0)
MCH: 28.9 pg (ref 26.0–34.0)
MCHC: 33 g/dL (ref 30.0–36.0)
MCV: 87.6 fL (ref 78.0–100.0)
PLATELETS: 203 10*3/uL (ref 150–400)
RBC: 4.36 MIL/uL (ref 3.87–5.11)
RDW: 14.2 % (ref 11.5–15.5)
WBC: 7.4 10*3/uL (ref 4.0–10.5)

## 2017-03-20 NOTE — Patient Instructions (Signed)
Teresa Harvey  03/20/2017   Your procedure is scheduled on:  03/21/2017  Enter through the Main Entrance of [facility] at 1045 AM.  Pick up the phone at the desk and dial [phone]  Call this number if you have problems the morning of [phone]  Remember:   Do not eat food:After Midnight.  Do not drink clear liquids: After Midnight.  Take these medicines the morning of surgery with A SIP OF WATER: valtrex   Do not wear jewelry, make-up or nail polish.  Do not wear lotions, powders, or perfumes. Do not wear deodorant.  Do not shave 48 hours prior to surgery.  Do not bring valuables to the hospital.  [facility] is not   responsible for any belongings or valuables brought to the hospital.  Contacts, dentures or bridgework may not be worn into surgery.  Leave suitcase in the car. After surgery it may be brought to your room.  For patients admitted to the hospital, checkout time is 11:00 AM the day of              discharge.    N/A   Please read over the following fact sheets that you were given:   Surgical Site Infection Prevention

## 2017-03-20 NOTE — Pre-Procedure Instructions (Signed)
Bonnielee Haff notified of lab status

## 2017-03-21 ENCOUNTER — Inpatient Hospital Stay (HOSPITAL_COMMUNITY): Payer: 59 | Admitting: Anesthesiology

## 2017-03-21 ENCOUNTER — Encounter (HOSPITAL_COMMUNITY): Payer: Self-pay | Admitting: *Deleted

## 2017-03-21 ENCOUNTER — Encounter (HOSPITAL_COMMUNITY)
Admission: AD | Disposition: A | Discharge status: Home / Self Care | Payer: Self-pay | Source: Ambulatory Visit | Attending: Obstetrics & Gynecology

## 2017-03-21 ENCOUNTER — Inpatient Hospital Stay (HOSPITAL_COMMUNITY)
Admission: AD | Admit: 2017-03-21 | Discharge: 2017-03-23 | DRG: 788 | Disposition: A | Discharge status: Home / Self Care | Payer: 59 | Source: Ambulatory Visit | Attending: Obstetrics & Gynecology | Admitting: Obstetrics & Gynecology

## 2017-03-21 DIAGNOSIS — Z3A39 39 weeks gestation of pregnancy: Secondary | ICD-10-CM

## 2017-03-21 DIAGNOSIS — Z3A00 Weeks of gestation of pregnancy not specified: Secondary | ICD-10-CM | POA: Diagnosis not present

## 2017-03-21 DIAGNOSIS — Z880 Allergy status to penicillin: Secondary | ICD-10-CM

## 2017-03-21 DIAGNOSIS — K219 Gastro-esophageal reflux disease without esophagitis: Secondary | ICD-10-CM | POA: Diagnosis present

## 2017-03-21 DIAGNOSIS — O134 Gestational [pregnancy-induced] hypertension without significant proteinuria, complicating childbirth: Secondary | ICD-10-CM | POA: Diagnosis not present

## 2017-03-21 DIAGNOSIS — O9962 Diseases of the digestive system complicating childbirth: Secondary | ICD-10-CM | POA: Diagnosis present

## 2017-03-21 DIAGNOSIS — A6000 Herpesviral infection of urogenital system, unspecified: Secondary | ICD-10-CM | POA: Diagnosis present

## 2017-03-21 DIAGNOSIS — Z91040 Latex allergy status: Secondary | ICD-10-CM

## 2017-03-21 DIAGNOSIS — Z87410 Personal history of cervical dysplasia: Secondary | ICD-10-CM | POA: Diagnosis not present

## 2017-03-21 DIAGNOSIS — O9832 Other infections with a predominantly sexual mode of transmission complicating childbirth: Principal | ICD-10-CM | POA: Diagnosis present

## 2017-03-21 LAB — RPR: RPR Ser Ql: NONREACTIVE

## 2017-03-21 LAB — TYPE AND SCREEN
ABO/RH(D): A POS
ANTIBODY SCREEN: NEGATIVE

## 2017-03-21 SURGERY — Surgical Case
Anesthesia: Spinal | Site: Abdomen | Wound class: Clean Contaminated

## 2017-03-21 MED ORDER — MENTHOL 3 MG MT LOZG: 1.0000 | LOZENGE | OROMUCOSAL | Status: DC | PRN

## 2017-03-21 MED ORDER — BUPIVACAINE IN DEXTROSE 0.75-8.25 % IT SOLN
INTRATHECAL | Status: DC | PRN
  Administered 2017-03-21: 1.5 mL via INTRATHECAL

## 2017-03-21 MED ORDER — ONDANSETRON HCL 4 MG/2ML IJ SOLN
INTRAMUSCULAR | Status: AC
  Filled 2017-03-21: qty 2

## 2017-03-21 MED ORDER — OXYCODONE-ACETAMINOPHEN 5-325 MG PO TABS: 2.0000 | ORAL_TABLET | ORAL | Status: DC | PRN

## 2017-03-21 MED ORDER — ONDANSETRON HCL 4 MG/2ML IJ SOLN: 4.0000 mg | Freq: Three times a day (TID) | INTRAMUSCULAR | Status: DC | PRN

## 2017-03-21 MED ORDER — SIMETHICONE 80 MG PO CHEW
80.0000 mg | CHEWABLE_TABLET | Freq: Three times a day (TID) | ORAL | Status: DC
  Administered 2017-03-22 – 2017-03-23 (×2): 80 mg via ORAL
  Filled 2017-03-21 (×2): qty 1

## 2017-03-21 MED ORDER — SENNOSIDES-DOCUSATE SODIUM 8.6-50 MG PO TABS
2.0000 | ORAL_TABLET | ORAL | Status: DC
  Administered 2017-03-21 – 2017-03-23 (×2): 2 via ORAL
  Filled 2017-03-21 (×2): qty 2

## 2017-03-21 MED ORDER — IBUPROFEN 600 MG PO TABS
600.0000 mg | ORAL_TABLET | Freq: Four times a day (QID) | ORAL | Status: DC
  Administered 2017-03-21 – 2017-03-23 (×7): 600 mg via ORAL
  Filled 2017-03-21 (×7): qty 1

## 2017-03-21 MED ORDER — ACETAMINOPHEN 325 MG PO TABS
650.0000 mg | ORAL_TABLET | ORAL | Status: DC | PRN
  Administered 2017-03-22: 650 mg via ORAL
  Filled 2017-03-21: qty 2

## 2017-03-21 MED ORDER — NALBUPHINE HCL 10 MG/ML IJ SOLN: 5.0000 mg | INTRAMUSCULAR | Status: DC | PRN

## 2017-03-21 MED ORDER — DIPHENHYDRAMINE HCL 25 MG PO CAPS: 25.0000 mg | ORAL_CAPSULE | ORAL | Status: DC | PRN

## 2017-03-21 MED ORDER — ONDANSETRON HCL 4 MG/2ML IJ SOLN
INTRAMUSCULAR | Status: DC | PRN
  Administered 2017-03-21: 4 mg via INTRAVENOUS

## 2017-03-21 MED ORDER — MORPHINE SULFATE (PF) 0.5 MG/ML IJ SOLN
INTRAMUSCULAR | Status: AC
  Filled 2017-03-21: qty 10

## 2017-03-21 MED ORDER — LACTATED RINGERS IV SOLN
INTRAVENOUS | Status: DC | PRN
  Administered 2017-03-21: 13:00:00 via INTRAVENOUS

## 2017-03-21 MED ORDER — METOCLOPRAMIDE HCL 5 MG/ML IJ SOLN
INTRAMUSCULAR | Status: AC
  Filled 2017-03-21: qty 2

## 2017-03-21 MED ORDER — OXYCODONE HCL 5 MG PO TABS: 5.0000 mg | ORAL_TABLET | Freq: Once | ORAL | Status: DC | PRN

## 2017-03-21 MED ORDER — NALBUPHINE HCL 10 MG/ML IJ SOLN: 5.0000 mg | Freq: Once | INTRAMUSCULAR | Status: DC | PRN

## 2017-03-21 MED ORDER — MEPERIDINE HCL 25 MG/ML IJ SOLN: 6.2500 mg | INTRAMUSCULAR | Status: DC | PRN

## 2017-03-21 MED ORDER — ZOLPIDEM TARTRATE 5 MG PO TABS: 5.0000 mg | ORAL_TABLET | Freq: Every evening | ORAL | Status: DC | PRN

## 2017-03-21 MED ORDER — LACTATED RINGERS IV SOLN
INTRAVENOUS | Status: DC
  Administered 2017-03-21: 19:00:00 via INTRAVENOUS

## 2017-03-21 MED ORDER — OXYCODONE-ACETAMINOPHEN 5-325 MG PO TABS
1.0000 | ORAL_TABLET | ORAL | Status: DC | PRN
Start: 2017-03-21 — End: 2017-03-23
  Administered 2017-03-22 – 2017-03-23 (×2): 1 via ORAL
  Filled 2017-03-21 (×2): qty 1

## 2017-03-21 MED ORDER — EPHEDRINE 5 MG/ML INJ
INTRAVENOUS | Status: AC
  Filled 2017-03-21: qty 10

## 2017-03-21 MED ORDER — OXYTOCIN 40 UNITS IN LACTATED RINGERS INFUSION - SIMPLE MED: 2.5000 [IU]/h | INTRAVENOUS | Status: AC

## 2017-03-21 MED ORDER — OXYTOCIN 10 UNIT/ML IJ SOLN
INTRAVENOUS | Status: DC | PRN
  Administered 2017-03-21: 40 [IU] via INTRAVENOUS

## 2017-03-21 MED ORDER — DIPHENHYDRAMINE HCL 25 MG PO CAPS: 25.0000 mg | ORAL_CAPSULE | Freq: Four times a day (QID) | ORAL | Status: DC | PRN

## 2017-03-21 MED ORDER — DIBUCAINE 1 % RE OINT: 1.0000 "application " | TOPICAL_OINTMENT | RECTAL | Status: DC | PRN

## 2017-03-21 MED ORDER — PROMETHAZINE HCL 25 MG/ML IJ SOLN
6.2500 mg | Freq: Four times a day (QID) | INTRAMUSCULAR | Status: DC | PRN
  Administered 2017-03-21: 6.25 mg via INTRAVENOUS
  Filled 2017-03-21: qty 1

## 2017-03-21 MED ORDER — BUPIVACAINE IN DEXTROSE 0.75-8.25 % IT SOLN
INTRATHECAL | Status: AC
  Filled 2017-03-21: qty 2

## 2017-03-21 MED ORDER — HYDROMORPHONE HCL 1 MG/ML IJ SOLN: 0.2500 mg | INTRAMUSCULAR | Status: DC | PRN

## 2017-03-21 MED ORDER — TETANUS-DIPHTH-ACELL PERTUSSIS 5-2.5-18.5 LF-MCG/0.5 IM SUSP: 0.5000 mL | Freq: Once | INTRAMUSCULAR | Status: DC

## 2017-03-21 MED ORDER — KETOROLAC TROMETHAMINE 30 MG/ML IJ SOLN
INTRAMUSCULAR | Status: AC
  Filled 2017-03-21: qty 1

## 2017-03-21 MED ORDER — PHENYLEPHRINE 8 MG IN D5W 100 ML (0.08MG/ML) PREMIX OPTIME
INJECTION | INTRAVENOUS | Status: DC | PRN
  Administered 2017-03-21: 60 ug/min via INTRAVENOUS

## 2017-03-21 MED ORDER — MORPHINE SULFATE (PF) 0.5 MG/ML IJ SOLN
INTRAMUSCULAR | Status: DC | PRN
  Administered 2017-03-21: .2 mg via EPIDURAL

## 2017-03-21 MED ORDER — SOD CITRATE-CITRIC ACID 500-334 MG/5ML PO SOLN
ORAL | Status: AC
  Administered 2017-03-21: 30 mL
  Filled 2017-03-21: qty 15

## 2017-03-21 MED ORDER — WITCH HAZEL-GLYCERIN EX PADS: 1.0000 "application " | MEDICATED_PAD | CUTANEOUS | Status: DC | PRN

## 2017-03-21 MED ORDER — METOCLOPRAMIDE HCL 5 MG/ML IJ SOLN
INTRAMUSCULAR | Status: DC | PRN
  Administered 2017-03-21 (×2): 5 mg via INTRAVENOUS

## 2017-03-21 MED ORDER — PRENATAL MULTIVITAMIN CH
1.0000 | ORAL_TABLET | Freq: Every day | ORAL | Status: DC
  Administered 2017-03-22 – 2017-03-23 (×2): 1 via ORAL
  Filled 2017-03-21 (×2): qty 1

## 2017-03-21 MED ORDER — OXYCODONE HCL 5 MG/5ML PO SOLN: 5.0000 mg | Freq: Once | ORAL | Status: DC | PRN

## 2017-03-21 MED ORDER — DEXTROSE 5 % IV SOLN
INTRAVENOUS | Status: AC
  Administered 2017-03-21: 116 mL via INTRAVENOUS
  Filled 2017-03-21: qty 10

## 2017-03-21 MED ORDER — SODIUM CHLORIDE 0.9 % IR SOLN
Status: DC | PRN
  Administered 2017-03-21: 1000 mL

## 2017-03-21 MED ORDER — NALOXONE HCL 0.4 MG/ML IJ SOLN: 0.4000 mg | INTRAMUSCULAR | Status: DC | PRN

## 2017-03-21 MED ORDER — SIMETHICONE 80 MG PO CHEW: 80.0000 mg | CHEWABLE_TABLET | ORAL | Status: DC | PRN

## 2017-03-21 MED ORDER — SCOPOLAMINE 1 MG/3DAYS TD PT72
1.0000 | MEDICATED_PATCH | Freq: Once | TRANSDERMAL | Status: DC
  Administered 2017-03-21: 1.5 mg via TRANSDERMAL
  Filled 2017-03-21: qty 1

## 2017-03-21 MED ORDER — SIMETHICONE 80 MG PO CHEW
80.0000 mg | CHEWABLE_TABLET | ORAL | Status: DC
  Administered 2017-03-21 – 2017-03-23 (×2): 80 mg via ORAL
  Filled 2017-03-21 (×2): qty 1

## 2017-03-21 MED ORDER — STERILE WATER FOR IRRIGATION IR SOLN
Status: DC | PRN
  Administered 2017-03-21: 1000 mL

## 2017-03-21 MED ORDER — DIPHENHYDRAMINE HCL 50 MG/ML IJ SOLN: 12.5000 mg | INTRAMUSCULAR | Status: DC | PRN

## 2017-03-21 MED ORDER — COCONUT OIL OIL: 1.0000 "application " | TOPICAL_OIL | Status: DC | PRN

## 2017-03-21 MED ORDER — NALOXONE HCL 2 MG/2ML IJ SOSY: 1.0000 ug/kg/h | PREFILLED_SYRINGE | INTRAMUSCULAR | Status: DC | PRN

## 2017-03-21 MED ORDER — OXYTOCIN 10 UNIT/ML IJ SOLN
INTRAMUSCULAR | Status: AC
  Filled 2017-03-21: qty 4

## 2017-03-21 MED ORDER — LACTATED RINGERS IV SOLN
INTRAVENOUS | Status: DC
  Administered 2017-03-21 (×2): via INTRAVENOUS

## 2017-03-21 MED ORDER — SODIUM CHLORIDE 0.9% FLUSH: 3.0000 mL | INTRAVENOUS | Status: DC | PRN

## 2017-03-21 MED ORDER — PROMETHAZINE HCL 25 MG/ML IJ SOLN: 6.2500 mg | Freq: Once | INTRAMUSCULAR | Status: DC

## 2017-03-21 MED ORDER — KETOROLAC TROMETHAMINE 30 MG/ML IJ SOLN
30.0000 mg | Freq: Once | INTRAMUSCULAR | Status: DC | PRN
  Administered 2017-03-21: 30 mg via INTRAVENOUS

## 2017-03-21 MED ORDER — PROMETHAZINE HCL 25 MG/ML IJ SOLN: 6.2500 mg | INTRAMUSCULAR | Status: DC | PRN

## 2017-03-21 MED ORDER — PHENYLEPHRINE 8 MG IN D5W 100 ML (0.08MG/ML) PREMIX OPTIME
INJECTION | INTRAVENOUS | Status: AC
  Filled 2017-03-21: qty 100

## 2017-03-21 SURGICAL SUPPLY — 31 items
APL SKNCLS STERI-STRIP NONHPOA (GAUZE/BANDAGES/DRESSINGS) ×1
BENZOIN TINCTURE PRP APPL 2/3 (GAUZE/BANDAGES/DRESSINGS) ×2 IMPLANT
CHLORAPREP W/TINT 26ML (MISCELLANEOUS) ×3 IMPLANT
CLAMP CORD UMBIL (MISCELLANEOUS) ×2 IMPLANT
CLOSURE WOUND 1/2 X4 (GAUZE/BANDAGES/DRESSINGS) ×1
CLOTH BEACON ORANGE TIMEOUT ST (SAFETY) ×3 IMPLANT
DRSG OPSITE POSTOP 4X10 (GAUZE/BANDAGES/DRESSINGS) ×3 IMPLANT
ELECT REM PT RETURN 9FT ADLT (ELECTROSURGICAL) ×3
ELECTRODE REM PT RTRN 9FT ADLT (ELECTROSURGICAL) ×1 IMPLANT
GLOVE BIO SURGEON STRL SZ7 (GLOVE) ×3 IMPLANT
GLOVE BIOGEL PI IND STRL 7.0 (GLOVE) ×2 IMPLANT
GLOVE BIOGEL PI INDICATOR 7.0 (GLOVE) ×4
GOWN STRL REUS W/TWL LRG LVL3 (GOWN DISPOSABLE) ×6 IMPLANT
NS IRRIG 1000ML POUR BTL (IV SOLUTION) ×3 IMPLANT
PACK C SECTION WH (CUSTOM PROCEDURE TRAY) ×3 IMPLANT
PAD ABD 7.5X8 STRL (GAUZE/BANDAGES/DRESSINGS) ×2 IMPLANT
PAD OB MATERNITY 4.3X12.25 (PERSONAL CARE ITEMS) ×3 IMPLANT
RTRCTR C-SECT PINK 25CM LRG (MISCELLANEOUS) ×2 IMPLANT
SPONGE GAUZE 4X4 12PLY STER LF (GAUZE/BANDAGES/DRESSINGS) ×4 IMPLANT
STRIP CLOSURE SKIN 1/2X4 (GAUZE/BANDAGES/DRESSINGS) ×1 IMPLANT
SUT MON AB-0 CT1 36 (SUTURE) ×6 IMPLANT
SUT PLAIN 2 0 (SUTURE) ×3
SUT PLAIN ABS 2-0 CT1 27XMFL (SUTURE) IMPLANT
SUT VIC AB 0 CT1 27 (SUTURE) ×6
SUT VIC AB 0 CT1 27XBRD ANBCTR (SUTURE) ×2 IMPLANT
SUT VIC AB 2-0 CT1 27 (SUTURE) ×3
SUT VIC AB 2-0 CT1 TAPERPNT 27 (SUTURE) ×1 IMPLANT
SUT VIC AB 4-0 KS 27 (SUTURE) ×3 IMPLANT
TAPE CLOTH SURG 4X10 WHT LF (GAUZE/BANDAGES/DRESSINGS) ×2 IMPLANT
TOWEL OR 17X24 6PK STRL BLUE (TOWEL DISPOSABLE) ×3 IMPLANT
TRAY FOLEY BAG SILVER LF 14FR (SET/KITS/TRAYS/PACK) ×2 IMPLANT

## 2017-03-21 NOTE — Anesthesia Postprocedure Evaluation (Signed)
Anesthesia Post Note  Patient: Teresa Harvey  Procedure(s) Performed: Primary CESAREAN SECTION (N/A Abdomen)     Patient location during evaluation: Mother Baby Anesthesia Type: Spinal Level of consciousness: awake and alert and oriented Pain management: satisfactory to patient Vital Signs Assessment: post-procedure vital signs reviewed and stable Respiratory status: spontaneous breathing and nonlabored ventilation Cardiovascular status: stable Postop Assessment: no headache, no backache, patient able to bend at knees, no signs of nausea or vomiting and adequate PO intake Anesthetic complications: no    Last Vitals:  Vitals:   03/21/17 1617 03/21/17 1715  BP: (!) 141/79 137/90  Pulse: 96 (!) 119  Resp: 18 18  Temp: 36.4 C 36.6 C  SpO2: 100% 98%    Last Pain:  Vitals:   03/21/17 1715  TempSrc: Oral  PainSc: 0-No pain   Pain Goal:                 Ovadia Lopp

## 2017-03-21 NOTE — Anesthesia Preprocedure Evaluation (Signed)
Anesthesia Evaluation  Patient identified by MRN, date of birth, ID band Patient awake    Reviewed: Allergy & Precautions, NPO status , Patient's Chart, lab work & pertinent test results  Airway Mallampati: II  TM Distance: >3 FB Neck ROM: Full    Dental no notable dental hx.    Pulmonary neg pulmonary ROS,    Pulmonary exam normal breath sounds clear to auscultation       Cardiovascular hypertension, negative cardio ROS Normal cardiovascular exam Rhythm:Regular Rate:Normal     Neuro/Psych negative neurological ROS  negative psych ROS   GI/Hepatic negative GI ROS, Neg liver ROS,   Endo/Other  negative endocrine ROS  Renal/GU negative Renal ROS     Musculoskeletal negative musculoskeletal ROS (+)   Abdominal   Peds  Hematology negative hematology ROS (+)   Anesthesia Other Findings   Reproductive/Obstetrics negative OB ROS                             Anesthesia Physical Anesthesia Plan  ASA: II  Anesthesia Plan: Spinal   Post-op Pain Management:    Induction:   PONV Risk Score and Plan: 4 or greater and Ondansetron, Scopolamine patch - Pre-op and Treatment may vary due to age or medical condition  Airway Management Planned:   Additional Equipment:   Intra-op Plan:   Post-operative Plan:   Informed Consent: I have reviewed the patients History and Physical, chart, labs and discussed the procedure including the risks, benefits and alternatives for the proposed anesthesia with the patient or authorized representative who has indicated his/her understanding and acceptance.   Dental advisory given  Plan Discussed with: CRNA  Anesthesia Plan Comments:         Anesthesia Quick Evaluation

## 2017-03-21 NOTE — Transfer of Care (Signed)
Immediate Anesthesia Transfer of Care Note  Patient: Teresa Harvey  Procedure(s) Performed: Primary CESAREAN SECTION (N/A Abdomen)  Patient Location: PACU  Anesthesia Type:Spinal  Level of Consciousness: awake, alert , oriented and patient cooperative  Airway & Oxygen Therapy: Patient Spontanous Breathing  Post-op Assessment: Report given to RN and Post -op Vital signs reviewed and stable  Post vital signs: Reviewed and stable  Last Vitals:  Vitals:   03/21/17 1102  BP: 139/90  Pulse: 86  Resp: 20  Temp: (!) 36.4 C    Last Pain:  Vitals:   03/21/17 1102  TempSrc: Oral  PainSc: 0-No pain         Complications: No apparent anesthesia complications

## 2017-03-21 NOTE — Anesthesia Postprocedure Evaluation (Signed)
Anesthesia Post Note  PatientJefferson Fuelffner  Procedure(s) Performed: Primary CESAREAN SECTION (N/A Abdomen)     Patient location during evaluation: PACU Anesthesia Type: Spinal Level of consciousness: awake and alert Pain management: pain level controlled Vital Signs Assessment: post-procedure vital signs reviewed and stable Respiratory status: spontaneous breathing and respiratory function stable Cardiovascular status: blood pressure returned to baseline and stable Postop Assessment: spinal receding Anesthetic complications: no    Last Vitals:  Vitals:   03/21/17 1600 03/21/17 1617  BP: 131/86 (!) 141/79  Pulse: 91 96  Resp: 16 18  Temp:  36.4 C  SpO2: 99% 100%    Last Pain:  Vitals:   03/21/17 1617  TempSrc: Axillary  PainSc:    Pain Goal:                 [name]

## 2017-03-21 NOTE — Anesthesia Procedure Notes (Signed)
Spinal  Patient location during procedure: OR Staffing Anesthesiologist: Nolon Nations Performed: anesthesiologist  Preanesthetic Checklist Completed: patient identified, site marked, surgical consent, pre-op evaluation, timeout performed, IV checked, risks and benefits discussed and monitors and equipment checked Spinal Block Patient position: sitting Prep: site prepped and draped and DuraPrep Patient monitoring: heart rate, continuous pulse ox and blood pressure Approach: midline Location: L3-4 Injection technique: single-shot Needle Needle type: Sprotte  Needle gauge: 24 G Needle length: 9 cm Additional Notes Expiration date of kit checked and confirmed. Patient tolerated procedure well, without complications.

## 2017-03-21 NOTE — Addendum Note (Signed)
Addendum  created 03/21/17 1826 by Shanon Payor, CRNA   Sign clinical note

## 2017-03-21 NOTE — H&P (Signed)
Teresa Harvey is a 36 y.o. female presenting for elective C/section at 39 wks for history of genital herpes and recent elevated BPs. Genital HSV- outbreaks in 1st trim only.  AMA, IVF-ICSI (female factor) pregnancy. Not planning any more kids since does not have any more embryos.  Uncomplicated pregnancy course. Sono at 28 wks noted low AFI 9.6 cm but f/up in 2 weeks AFI was normal at 12.  Growth AGA at 28 wk, 34wks.  BPs elevated 1 week back and was taken out of work with modified bedrest, which improved BPs and swelling.  GERD Zantac   OB History    Gravida Para Term Preterm AB Living   3       2 0   SAB TAB Ectopic Multiple Live Births     2           Past Medical History:  Diagnosis Date  . Cervical dysplasia 2004   no treatment, normal paps after  . HSV infection   . Hx of gonorrhea   . Medical history non-contributory   . Pregnancy induced hypertension   . Vaginal Pap smear, abnormal    Past Surgical History:  Procedure Laterality Date  . COLPOSCOPY    . ESOPHAGOGASTRODUODENOSCOPY     Family History: family history includes Bipolar disorder in her mother; Cerebral palsy in her maternal grandmother; Heart attack in her father; Heart disease in her father; Hypertension in her maternal grandmother. Social History:  reports that she has never smoked. She has never used smokeless tobacco. She reports that she does not drink alcohol or use drugs.     Maternal Diabetes: No Genetic Screening: Normal Panorama. AFP1 neg Maternal Ultrasounds/Referrals: Normal Fetal Ultrasounds or other Referrals:  None Maternal Substance Abuse:  No Significant Maternal Medications:  IVF hormones in 1st trim. Pepcid. Valtrex (inconsistent due to fear of fetal exposure) Significant Maternal Lab Results:  Lab values include: Group B Strep negative Other Comments:  IVF- ICSI , HSV history, outbreaks in 1st trim only.   ROS neg  History   Blood pressure 139/90, pulse 86, temperature (!) 97.5 F  (36.4 C), temperature source Oral, resp. rate 20, height  (1.676 m), weight 242 lb (109.8 kg), last menstrual period 06/21/2016. Exam Physical Exam    A&O x 3, no acute distress. Pleasant HEENT neg, no thyromegaly Lungs CTA bilat CV RRR, S1S2 normal Abdo soft, non tender, non acute Extr no edema/ tenderness Pelvic closed, long cervix FHT  140s Toco none  Prenatal labs: ABO, Rh: --/--/A POS (10/12 1050) Antibody: NEG (10/12 1050) Rubella: Immune (03/28 0000) RPR: Non Reactive (10/11 1000)  HBsAg: Negative (03/28 0000)  HIV: Non-reactive (03/28 0000)  GBS: Negative (09/20 0000)  GLucola normal  Assessment/Plan: 36 yo, G3P0, 39 wks dated by IVF/ET. H/o genital HVS with no outbreaks but patient requests csection due to unforeseen neonatal exposure. She has recently developed elevated BPs, improved with bedrest, hence surgery was moved from 40 wks to 39 wks.   Risks/complications of surgery reviewed incl infection, bleeding, damage to internal organs including bladder, bowels, ureters, blood vessels, other risks from anesthesia, VTE and delayed complications of any surgery, complications in future surgery reviewed. Also discussed neonatal complications incl difficult delivery, laceration, vacuum assistance, TTN etc. Pt understands and agrees, all concerns addressed.     Pinkney Venard R 03/21/2017, 12:27 PM

## 2017-03-21 NOTE — Op Note (Signed)
Cesarean Section Procedure Note   Teresa Harvey  03/21/2017  Indications: Scheduled Proceedure/Maternal Request, IVF pregnancy, HSV 2  History, declining vaginal trial. Gestational HTN but improved with bedrest.   Pre-operative Diagnosis: HSV 2  Gestational hypertension, IVF pregnancy.   Post-operative Diagnosis: Same   Surgeon: Shea Evans, MD - Primary   Assistants: Arlan Organ, CNM  Anesthesia: Spinal   Procedure Details:  The patient was seen in the Holding Room. The risks, benefits, complications, treatment options, and expected outcomes were discussed with the patient. The patient concurred with the proposed plan, giving informed consent. identified as Teresa Harvey and the procedure verified as C-Section Delivery. A Time Out was held and the above information confirmed. Clindamycin/ Gentamicin given.  After induction of anesthesia, the patient was draped and prepped in the usual sterile manner. A Pfannenstiel incision was made and carried down through the subcutaneous tissue to the fascia. Fascial incision was made and extended transversely. The fascia was separated from the underlying rectus tissue superiorly and inferiorly. The peritoneum was identified and entered. Peritoneal incision was extended longitudinally. Alexis retractor placed. The utero-vesical peritoneal reflection was incised transversely and the bladder flap was bluntly freed from the lower uterine segment. A low transverse uterine incision was made. Clear fluid drained. Delivered from cephalic presentation was a vigorous female infant with Apgar scores of 9 at one minute and 9 at five minutes. Cord ph was not sent the umbilical cord was clamped at minute and cut cord blood was obtained for evaluation. The placenta was removed Intact and appeared normal. The uterine outline, tubes and ovaries appeared normal}. The uterine incision was closed with running locked sutures of 0 Monocryl in two layers.  Hemostasis  was observed. Alexis removed. Peritoneal closure with 2-0 Vicryl.  The fascia was then reapproximated with running sutures of 0Vicryl. The subcuticular closure was performed using 2-0plain gut. The skin was closed with 4-0Vicryl.   Instrument, sponge, and needle counts were correct prior the abdominal closure and were correct at the conclusion of the case.    Findings: Girl infant delivered via Kerr hysterotomy at 13.23 hours. Apgars 9 and 9. 1 minute delayed cord clamping done. Placenta, cord, uterus, tubes ovaries all normal. 2 layer closure of the hysterotomy.    Estimated Blood Loss:  600 cc     Total IV Fluids: 1100 ml LR   Urine Output: 50CC OF clear urine  Specimens: cord blood  Complications: no complications  Disposition: PACU - hemodynamically stable.   Maternal Condition: stable   Baby condition / location:  Couplet care / Skin to Skin  Attending Attestation: I performed the procedure.   Signed: Surgeon(s): Shea Evans, MD

## 2017-03-22 LAB — CBC
HCT: 35.3 % — ABNORMAL LOW (ref 36.0–46.0)
Hemoglobin: 11.5 g/dL — ABNORMAL LOW (ref 12.0–15.0)
MCH: 28.9 pg (ref 26.0–34.0)
MCHC: 32.6 g/dL (ref 30.0–36.0)
MCV: 88.7 fL (ref 78.0–100.0)
PLATELETS: 177 10*3/uL (ref 150–400)
RBC: 3.98 MIL/uL (ref 3.87–5.11)
RDW: 14.4 % (ref 11.5–15.5)
WBC: 8.1 10*3/uL (ref 4.0–10.5)

## 2017-03-22 LAB — BIRTH TISSUE RECOVERY COLLECTION (PLACENTA DONATION)

## 2017-03-22 NOTE — Lactation Note (Addendum)
This note was copied from a baby's chart. Lactation Consultation Note: Infant is 21 hours old and has had 1 attempt to breastfeed and has had 3 bottles. Mother reports that she has attempt to breastfeed infant on cue.  Assist mother with placing infant in football hold. Infant latches for a few sucks on and off . Mother has semi-flat nipples . Assist with nipple stimulation and repeated attempts to latch infant . Infant placed on the Rt breast in football hold. Mothers right nipple more erect but long in shape. Infant is able to get better depth on the rt breast.  Mother was taught to hand express and observed a few drops of colostrum. Staff nurse to instruct in the use of a hand pump and give shells. Advised mother to pre-pump and continue to cue base feed and at least 8-12 times in 24 hours. Mother plans to use ebm and formula. Mother advised to offer formula after attempt to breastfeed. Encouraged mother to call for assistance with latch. Discussed setting up DEBP and pump after each feeding attempt if unable to get infant latched with good depth.  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 Mother unsure of which pump she wants . Advised mother to go to [employer] and look at different types.    Patient Name: Teresa Harvey ZOXWR'U Date: 03/22/2017 Reason for consult: Initial assessment   Maternal Data Has patient been taught Hand Expression?: Yes Does the patient have breastfeeding experience prior to this delivery?: No  Feeding Feeding Type: Breast Fed Length of feed: 10 min (suckles on and off )  LATCH Score Latch: Repeated attempts needed to sustain latch, nipple held in mouth throughout feeding, stimulation needed to elicit sucking reflex.  Audible Swallowing: A few with stimulation  Type of Nipple: Flat  Comfort (Breast/Nipple): Soft / non-tender  Hold (Positioning): Assistance needed to correctly position infant at breast and  maintain latch.  LATCH Score: 6  Interventions Interventions: Breast feeding basics reviewed;Assisted with latch;Skin to skin;Hand express;Adjust position;Support pillows;Position options;Expressed milk;Shells;Hand pump  Lactation Tools Discussed/Used Initiated by:: staff nurse Date initiated:: 03/22/17   Consult Status Consult Status: Follow-up Date: 03/23/17 Follow-up type: In-patient    Stevan [facility] 03/22/2017, 11:36 AM

## 2017-03-22 NOTE — Progress Notes (Signed)
Subjective: POD# 1 Information for the patient's newborn:  Kentley, [place] Girl Logen [[id number]]  female    Reports feeling well. Feeding: breast Patient reports tolerating PO.  Breast symptoms: difficulty latching, short nipple shaft and large breasts, working w/ LC Pain controlled with PO meds Denies HA/SOB/C/P/N/V/dizziness. Flatus present. She reports vaginal bleeding as normal, without clots.  She is ambulating, urinating without difficulty.     Objective:   VS:    Vitals:   03/21/17 1920 03/21/17 2320 03/22/17 0421 03/22/17 0803  BP: 125/80 123/78 110/68 116/73  Pulse: 82 68 69 86  Resp: Temp: 97.9 F (36.6 C) 97.9 F (36.6 C) 98.1 F (36.7 C) 98.2 F (36.8 C)  TempSrc: Oral Oral Oral   SpO2:    97%  Weight:      Height:         Intake/Output Summary (Last 24 hours) at 03/22/17 0955 Last data filed at 03/22/17 0716  Gross per 24 hour  Intake              525 ml  Output             1301 ml  Net             -776 ml        Recent Labs  03/20/17 1000  WBC 7.4  HGB 12.6  HCT 38.2  PLT 203     Blood type: --/--/A POS (10/12 1050)  Rubella: Immune (03/28 0000)     Physical Exam:  General: alert, cooperative and no distress Abdomen: soft, nontender, normal bowel sounds Incision: clean, dry, intact and pressure dressing removed, HCD in place Uterine Fundus: firm, below umbilicus, nontender Lochia: minimal Ext: +1 pedal edema, no redness or tenderness in the calves or thighs      Assessment/Plan: 36 y.o.   POD# 1. [id number]                  Principal Problem:   Postpartum care following cesarean delivery 10/12 Active Problems:   Cesarean delivery delivered - Elective, primary   Doing well, stable.               Advance diet as tolerated Encourage rest when baby rests Breastfeeding support Encourage to ambulate Routine post-op care  Neta Mends, CNM, MSN 03/22/2017, 9:55 AM

## 2017-03-22 NOTE — Lactation Note (Signed)
This note was copied from a baby's chart. Lactation Consultation Note  Patient Name: [name] Date: 03/22/2017 Reason for consult: Follow-up assessment   Baby 28 hours old. Request to assist with latch. Mother hand expressed. Drops expressed and given to baby on spoon. Assisted w/ latching on R side.  Baby opens wide, sucks for a few minutes and comes off and back on again. Encouraged mother to compress breast during feeding. Mother states she has had difficulty latching on L side. Had mother prepump w/ manual pump and then hand express. Baby latched in football hold off and on. Intermittent sucks and swallows observed. Mom encouraged to feed baby 8-12 times/24 hours and with feeding cues STS. Discussed cluster feeding.      Maternal Data Has patient been taught Hand Expression?: Yes  Feeding Feeding Type: Breast Fed Length of feed: 12 min  LATCH Score Latch: Grasps breast easily, tongue down, lips flanged, rhythmical sucking.  Audible Swallowing: A few with stimulation  Type of Nipple: Flat  Comfort (Breast/Nipple): Soft / non-tender  Hold (Positioning): Assistance needed to correctly position infant at breast and maintain latch.  LATCH Score: 7  Interventions Interventions: Breast feeding basics reviewed;Assisted with latch;Skin to skin;Pre-pump if needed;Expressed milk;Hand pump  Lactation Tools Discussed/Used Tools: Shells Shell Type: Inverted   Consult Status Consult Status: Follow-up Date: 03/23/17 Follow-up type: In-patient    Dahlia Byes [facility] 03/22/2017, 5:55 PM

## 2017-03-23 ENCOUNTER — Ambulatory Visit: Payer: Self-pay

## 2017-03-23 MED ORDER — SENNOSIDES-DOCUSATE SODIUM 8.6-50 MG PO TABS: 2.0000 | ORAL_TABLET | ORAL | Status: DC

## 2017-03-23 MED ORDER — SIMETHICONE 80 MG PO CHEW: 80.0000 mg | CHEWABLE_TABLET | Freq: Three times a day (TID) | ORAL | 0 refills | Status: DC

## 2017-03-23 MED ORDER — ACETAMINOPHEN 325 MG PO TABS: 650.0000 mg | ORAL_TABLET | ORAL | Status: DC | PRN

## 2017-03-23 MED ORDER — OXYCODONE-ACETAMINOPHEN 5-325 MG PO TABS: 1.0000 | ORAL_TABLET | ORAL | 0 refills | Status: DC | PRN

## 2017-03-23 MED ORDER — COCONUT OIL OIL: 1.0000 "application " | TOPICAL_OIL | 0 refills | Status: DC | PRN

## 2017-03-23 MED ORDER — IBUPROFEN 600 MG PO TABS
600.0000 mg | ORAL_TABLET | Freq: Four times a day (QID) | ORAL | 0 refills | Status: DC
Start: 2017-03-23 — End: 2020-05-25

## 2017-03-23 NOTE — Discharge Summary (Signed)
OB Discharge Summary     Patient Name: Teresa Harvey DOB: 09/20/1980 MRN: [id number]  Date of admission: 03/21/2017 Delivering MD: MODY, VAISHALI   Date of discharge: 03/23/2017  Admitting diagnosis: HSV 2  Gestational hypertension Intrauterine pregnancy: [redacted]w[redacted]d     Secondary diagnosis:  Principal Problem:   Postpartum care following cesarean delivery 10/12 Active Problems:   Cesarean delivery delivered - Elective, primary     Discharge diagnosis: Term Pregnancy Delivered                                                                                                Complications: None  Hospital course:  Sceduled C/S   36 y.o. yo G3P0020 at [redacted]w[redacted]d was admitted to the hospital 03/21/2017 for scheduled cesarean section with the following indication:Elective Primary.  Membrane Rupture Time/Date: 1:22 PM ,03/21/2017   Patient delivered a Viable infant.03/21/2017  Details of operation can be found in separate operative note.  Patient had an uncomplicated postpartum course.  She is ambulating, tolerating a regular diet, passing flatus, and urinating well. Patient is discharged home in stable condition on  03/23/17         Physical exam  Vitals:   03/22/17 0421 03/22/17 0803 03/22/17 1230 03/22/17 1905  BP: 110/68 116/73 134/74 127/73  Pulse: 69 86 80 85  Resp: Temp: 98.1 F (36.7 C) 98.2 F (36.8 C) 97.9 F (36.6 C) 97.8 F (36.6 C)  TempSrc: Oral   Oral  SpO2:  97%    Weight:      Height:       General: alert, cooperative and no distress Lochia: appropriate Uterine Fundus: firm Incision: Healing well with no significant drainage DVT Evaluation: No cords or calf tenderness. No significant calf/ankle edema. Labs: Lab Results  Component Value Date   WBC 8.1 03/22/2017   HGB 11.5 (L) 03/22/2017   HCT 35.3 (L) 03/22/2017   MCV 88.7 03/22/2017   PLT 177 03/22/2017   CMP Latest Ref Rng & Units 03/12/2017  Glucose 65 - 99 mg/dL 73  BUN 6 - 20 mg/dL 8   Creatinine 4.09 - 8.11 mg/dL 9.14  Sodium 782 - 956 mmol/L 135  Potassium 3.5 - 5.1 mmol/L 4.1  Chloride 101 - 111 mmol/L 105  CO2 22 - 32 mmol/L 23  Calcium 8.9 - 10.3 mg/dL 8.9  Total Protein 6.5 - 8.1 g/dL 7.0  Total Bilirubin 0.3 - 1.2 mg/dL 0.4  Alkaline Phos 38 - 126 U/L 145(H)  AST 15 - 41 U/L 19  ALT 14 - 54 U/L 16    Discharge instruction: per After Visit Summary and "Baby and Me Booklet".  After visit meds:  Allergies as of 03/23/2017      Reactions   Penicillins Swelling   REACTION: tongue swealling Has patient had a PCN reaction causing immediate rash, facial/tongue/throat swelling, SOB or lightheadedness with hypotension: yes Has patient had a PCN reaction causing severe rash involving mucus membranes or skin necrosis: no Has patient had a PCN reaction that required hospitalization: yes dr's office visit Has patient had a  PCN reaction occurring within the last 10 years: no If all of the above answers are "NO", then may proceed with Cephalosporin use.   Latex Itching, Rash   Redness and itching with latex exposure      Medication List    TAKE these medications   acetaminophen 325 MG tablet Commonly known as:  TYLENOL Take 2 tablets (650 mg total) by mouth every 4 (four) hours as needed (for pain scale < 4).   coconut oil Oil Apply 1 application topically as needed.   ibuprofen 600 MG tablet Commonly known as:  ADVIL,MOTRIN Take 1 tablet (600 mg total) by mouth every 6 (six) hours.   oxyCODONE-acetaminophen 5-325 MG tablet Commonly known as:  PERCOCET/ROXICET Take 1 tablet by mouth every 4 (four) hours as needed (pain scale 4-7).   prenatal multivitamin Tabs tablet Take 1 tablet by mouth daily at 12 noon.   senna-docusate 8.6-50 MG tablet Commonly known as:  Senokot-S Take 2 tablets by mouth daily.   simethicone 80 MG chewable tablet Commonly known as:  MYLICON Chew 1 tablet (80 mg total) by mouth 3 (three) times daily after meals.   valACYclovir  500 MG tablet Commonly known as:  VALTREX Take 500 mg by mouth daily.       Diet: routine diet  Activity: Advance as tolerated. Pelvic rest for 6 weeks.   Outpatient follow up:6 weeks Follow up Appt:No future appointments. Follow up Visit:No Follow-up on file.  Postpartum contraception: Not Discussed  Newborn Data: Live born female [place] Birth Weight: 7 lb 7.2 oz (3380 g) APGAR: 9, 9  Newborn Delivery   Birth date/time:  03/21/2017 13:23:00 Delivery type:  C-Section, Low Transverse  C-section categorization:  Primary     Baby Feeding: Bottle and Breast Disposition:home with mother   03/23/2017 Neta Mends, CNM

## 2017-03-23 NOTE — Lactation Note (Signed)
This note was copied from a baby's chart. Lactation Consultation Note: Mother reports that she is breastfeeding and bottle feeding. Mother has been pumping and reports that she is only getting small drops. Assist mother with hand expressing and observed clear drops of colostrum. Mother reports that she pumped a few drops of lite green liquid. Mother denies having any history of drainage from breast. Mother is a Producer, television/film/video.  Mother was given Medela metro pump and a copy of insurance card made . Mother advised to pump every 2-3 hours for 15-20 mins. Discussed treatment to prevent engorgement. Reviewed supply and demand with mother. Mother reports that she may pump and bottle feed when she gets home. Mother is aware of available LC services.   Patient Name: Girl Malkia Nippert ZOXWR'U Date: 03/23/2017     Maternal Data    Feeding    LATCH Score                   Interventions    Lactation Tools Discussed/Used     Consult Status      Michel Bickers 03/23/2017, 3:32 PM

## 2017-03-23 NOTE — Progress Notes (Addendum)
Subjective: POD#  Information for the patient's newborn:  Elinda, [place]un[place] Girl Magalie [[id number]]  female  Baby name: [place]  Reports feeling well, desires DC home. Feeding: breast and bottle Patient reports tolerating PO.  Breast symptoms: working on latch w/ LC Pain controlled with PO meds Denies HA/SOB/C/P/N/V/dizziness. Flatus present. She reports vaginal bleeding as normal, without clots.  She is ambulating, urinating without difficulty.     Objective:   VS:    Vitals:   03/22/17 0421 03/22/17 0803 03/22/17 1230 03/22/17 1905  BP: 110/68 116/73 134/74 127/73  Pulse: 69 86 80 85  Resp: Temp: 98.1 F (36.7 C) 98.2 F (36.8 C) 97.9 F (36.6 C) 97.8 F (36.6 C)  TempSrc: Oral   Oral  SpO2:  97%    Weight:      Height:        No intake or output data in the 24 hours ending 03/23/17 0847      Recent Labs  03/20/17 1000 03/22/17 0544  WBC 7.4 8.1  HGB 12.6 11.5*  HCT 38.2 35.3*  PLT 203 177     Blood type: --/--/A POS (10/12 1050)  Rubella: Immune (03/28 0000)     Physical Exam:  General: alert, cooperative and no distress Abdomen: soft, nontender, normal bowel sounds Incision: clean, dry and intact Uterine Fundus: firm, below umbilicus, nontender Lochia: minimal Ext: +1 pedal edema, no redness or tenderness in the calves or thighs      Assessment/Plan: 36 y.o.   POD# 2. [id number]                  Principal Problem:   Postpartum care following cesarean delivery 10/12 Active Problems:   Cesarean delivery delivered - Elective, primary   Doing well, stable.               Advance diet as tolerated Encourage rest when baby rests Breastfeeding support Encourage to ambulate Routine post-op care             DC home today w/ instructions  F/U at [facility] in 6 weeks and PRN   Neta Mends, CNM, MSN 03/23/2017, 8:47 AM

## 2017-03-27 ENCOUNTER — Encounter (HOSPITAL_COMMUNITY): Admission: RE | Admit: 2017-03-27 | Payer: 59 | Source: Ambulatory Visit

## 2017-03-27 HISTORY — DX: Gestational (pregnancy-induced) hypertension without significant proteinuria, unspecified trimester: O13.9

## 2017-03-27 HISTORY — DX: Personal history of other infectious and parasitic diseases: Z86.19

## 2017-06-20 ENCOUNTER — Encounter (HOSPITAL_COMMUNITY): Payer: Self-pay

## 2017-07-19 ENCOUNTER — Other Ambulatory Visit: Payer: Self-pay

## 2017-07-19 ENCOUNTER — Ambulatory Visit (HOSPITAL_COMMUNITY)
Admission: EM | Admit: 2017-07-19 | Discharge: 2017-07-19 | Disposition: A | Discharge status: Home / Self Care | Payer: 59 | Attending: Family Medicine | Admitting: Family Medicine

## 2017-07-19 ENCOUNTER — Encounter (HOSPITAL_COMMUNITY): Payer: Self-pay

## 2017-07-19 DIAGNOSIS — Z79899 Other long term (current) drug therapy: Secondary | ICD-10-CM | POA: Insufficient documentation

## 2017-07-19 DIAGNOSIS — J029 Acute pharyngitis, unspecified: Secondary | ICD-10-CM | POA: Insufficient documentation

## 2017-07-19 LAB — POCT RAPID STREP A: STREPTOCOCCUS, GROUP A SCREEN (DIRECT): NEGATIVE

## 2017-07-19 MED ORDER — PHENOL 1.4 % MT LIQD: 1.0000 | OROMUCOSAL | 0 refills | Status: DC | PRN

## 2017-07-19 MED ORDER — LIDOCAINE VISCOUS 2 % MT SOLN: 15.0000 mL | OROMUCOSAL | 0 refills | Status: DC | PRN

## 2017-07-19 MED ORDER — CROMOLYN SODIUM 5.2 MG/ACT NA AERS: 1.0000 | INHALATION_SPRAY | Freq: Four times a day (QID) | NASAL | 12 refills | Status: DC

## 2017-07-19 NOTE — Discharge Instructions (Signed)
Rapid strep negative. Symptoms are most likely due to viral illness. Start phenol for sore throat. Lidocaine solution as needed for additional relief, please do not eat or drink within 30-40 mins of use, as it can stunt your gag reflex. Cromolyn nasal spray for nasal congestion/drainage. You can use over the counter nasal saline rinse such as neti pot for nasal congestion. Monitor for any worsening of symptoms, swelling of the throat, trouble breathing, trouble swallowing, follow up for reevaluation.   Cromolyn nasal spray, lidocaine solution, phenol spray are all safe during breastfeeding.  For sore throat try using a honey-based tea. Use 3 teaspoons of honey with juice squeezed from half lemon. Place shaved pieces of ginger into 1/2-1 cup of water and warm over stove top. Then mix the ingredients and repeat every 4 hours as needed.

## 2017-07-19 NOTE — ED Provider Notes (Signed)
[facility]    CSN: [id number] Arrival date & time: 07/19/17  1641     History   Chief Complaint Chief Complaint  Patient presents with  . Sore Throat    HPI Teresa Harvey is a 37 y.o. female.   37 year old female comes in for 3-day history of sore throat.  Denies other symptoms such as cough, congestion, rhinorrhea.  Denies fever, chills, night sweats.  Has been taking throat lozenges with temporary relief.  Never smoker.  Positive sick contact.  Currently breast-feeding.      Past Medical History:  Diagnosis Date  . Cervical dysplasia 2004   no treatment, normal paps after  . HSV infection   . Hx of gonorrhea   . Medical history non-contributory   . Pregnancy induced hypertension   . Vaginal Pap smear, abnormal     Patient Active Problem List   Diagnosis Date Noted  . Postpartum care following cesarean delivery 10/12 03/22/2017  . Cesarean delivery delivered - Elective, primary 03/21/2017  . Epigastric abdominal pain 09/09/2014  . Seasonal allergies 09/09/2014  . Rash and nonspecific skin eruption 09/09/2014  . HSV infection   . HAY FEVER 06/01/2007    Past Surgical History:  Procedure Laterality Date  . CESAREAN SECTION N/A 03/21/2017   Procedure: Primary CESAREAN SECTION;  Surgeon: Shea EvansMody, Vaishali, MD;  [facility];  Service: Obstetrics;  Laterality: N/A;  EDD: 03/28/17 Allergy: Penicillin   . COLPOSCOPY    . ESOPHAGOGASTRODUODENOSCOPY      OB History    Gravida Para Term Preterm AB Living   3       2 0   SAB TAB Ectopic Multiple Live Births     2             Home Medications    Prior to Admission medications   Medication Sig Start Date End Date Taking? Authorizing Provider  Prenatal Vit-Fe Fumarate-FA (PRENATAL MULTIVITAMIN) TABS tablet Take 1 tablet by mouth daily at 12 noon.   Yes [provider]  valACYclovir (VALTREX) 500 MG tablet Take 500 mg by mouth daily. 02/13/17  Yes [provider]    acetaminophen (TYLENOL) 325 MG tablet Take 2 tablets (650 mg total) by mouth every 4 (four) hours as needed (for pain scale < 4). 03/23/17   Neta MendsPaul, Daniela C, CNM  coconut oil OIL Apply 1 application topically as needed. 03/23/17   Neta MendsPaul, Daniela C, CNM  cromolyn (NASALCROM) 5.2 MG/ACT nasal spray Place 1 spray into both nostrils 4 (four) times daily. 07/19/17   Cathie HoopsYu, Joseantonio Dittmar V, PA-C  ibuprofen (ADVIL,MOTRIN) 600 MG tablet Take 1 tablet (600 mg total) by mouth every 6 (six) hours. 03/23/17   Neta MendsPaul, Daniela C, CNM  lidocaine (XYLOCAINE) 2 % solution Use as directed 15 mLs in the mouth or throat as needed for mouth pain. 07/19/17   Cathie HoopsYu, Abrial Arrighi V, PA-C  oxyCODONE-acetaminophen (PERCOCET/ROXICET) 5-325 MG tablet Take 1 tablet by mouth every 4 (four) hours as needed (pain scale 4-7). 03/23/17   Neta MendsPaul, Daniela C, CNM  phenol (CHLORASEPTIC) 1.4 % LIQD Use as directed 1 spray in the mouth or throat as needed for throat irritation / pain. 07/19/17   Cathie HoopsYu, Lorain Fettes V, PA-C  senna-docusate (SENOKOT-S) 8.6-50 MG tablet Take 2 tablets by mouth daily. 03/24/17   Neta MendsPaul, Daniela C, CNM  simethicone (MYLICON) 80 MG chewable tablet Chew 1 tablet (80 mg total) by mouth 3 (three) times daily after meals. 03/23/17   Neta MendsPaul, Daniela C,  CNM    Family History Family History  Problem Relation Age of Onset  . Heart disease Father   . Heart attack Father   . Hypertension Maternal Grandmother   . Cerebral palsy Maternal Grandmother   . Bipolar disorder Mother     Social History Social History   Tobacco Use  . Smoking status: Never Smoker  . Smokeless tobacco: Never Used  Substance Use Topics  . Alcohol use: No    Alcohol/week: 0.0 oz  . Drug use: No     Allergies   Penicillins and Latex   Review of Systems Review of Systems  Reason unable to perform ROS: See HPI as above.     Physical Exam Triage Vital Signs ED Triage Vitals  Enc Vitals Group     BP 07/19/17 1747 117/71     Pulse Rate 07/19/17 1747 76     Resp 07/19/17  1747 16     Temp 07/19/17 1747 98.3 F (36.8 C)     Temp Source 07/19/17 1747 Oral     SpO2 07/19/17 1747 99 %     Weight --      Height --      Head Circumference --      Peak Flow --      Pain Score 07/19/17 1748 5     Pain Loc --      Pain Edu? --      Excl. in GC? --    No data found.  Updated Vital Signs BP 117/71 (BP Location: Left Arm)   Pulse 76   Temp 98.3 F (36.8 C) (Oral)   Resp 16   LMP 06/24/2017 (Exact Date)   SpO2 99%   Physical Exam  Constitutional: She is oriented to person, place, and time. She appears well-developed and well-nourished. No distress.  HENT:  Head: Normocephalic and atraumatic.  Right Ear: Tympanic membrane, external ear and ear canal normal. Tympanic membrane is not erythematous and not bulging.  Left Ear: Tympanic membrane, external ear and ear canal normal. Tympanic membrane is not erythematous and not bulging.  Nose: Nose normal. Right sinus exhibits no maxillary sinus tenderness and no frontal sinus tenderness. Left sinus exhibits no maxillary sinus tenderness and no frontal sinus tenderness.  Mouth/Throat: Uvula is midline and mucous membranes are normal. Posterior oropharyngeal erythema present. No tonsillar exudate.  Eyes: Conjunctivae are normal. Pupils are equal, round, and reactive to light.  Neck: Normal range of motion. Neck supple.  Cardiovascular: Normal rate, regular rhythm and normal heart sounds. Exam reveals no gallop and no friction rub.  No murmur heard. Pulmonary/Chest: Effort normal and breath sounds normal. She has no decreased breath sounds. She has no wheezes. She has no rhonchi. She has no rales.  Lymphadenopathy:    She has no cervical adenopathy.  Neurological: She is alert and oriented to person, place, and time.  Skin: Skin is warm and dry.  Psychiatric: She has a normal mood and affect. Her behavior is normal. Judgment normal.     UC Treatments / Results  Labs (all labs ordered are listed, but only  abnormal results are displayed) Labs Reviewed  CULTURE, GROUP A STREP [facility])  POCT RAPID STREP A    EKG  EKG Interpretation None       Radiology No results found.  Procedures Procedures (including critical care time)  Medications Ordered in UC Medications - No data to display   Initial Impression / Assessment and Plan / UC Course  I have  reviewed the triage vital signs and the nursing notes.  Pertinent labs & imaging results that were available during my care of the patient were reviewed by me and considered in my medical decision making (see chart for details).    Rapid strep negative. Symptomatic treatment as needed. Return precautions given.   Final Clinical Impressions(s) / UC Diagnoses   Final diagnoses:  Pharyngitis, unspecified etiology    ED Discharge Orders        Ordered    phenol (CHLORASEPTIC) 1.4 % LIQD  As needed     07/19/17 1829    lidocaine (XYLOCAINE) 2 % solution  As needed     07/19/17 1829    cromolyn (NASALCROM) 5.2 MG/ACT nasal spray  4 times daily     07/19/17 1829        Belinda Fisher, PA-C 07/19/17 1846

## 2017-07-19 NOTE — ED Triage Notes (Signed)
[facility]

## 2017-07-22 ENCOUNTER — Telehealth: Payer: 59 | Admitting: Family

## 2017-07-22 DIAGNOSIS — R05 Cough: Secondary | ICD-10-CM | POA: Diagnosis not present

## 2017-07-22 DIAGNOSIS — R059 Cough, unspecified: Secondary | ICD-10-CM

## 2017-07-22 LAB — CULTURE, GROUP A STREP (THRC)

## 2017-07-22 MED ORDER — FLUTICASONE PROPIONATE 50 MCG/ACT NA SUSP: 2.0000 | Freq: Every day | NASAL | 0 refills | Status: DC

## 2017-07-22 MED FILL — FLUTICASONE PROP 50 MCG SPR: 50 | 30 days supply | Qty: 16 | Fill #0

## 2017-07-22 NOTE — Progress Notes (Signed)
We are sorry you are not feeling well.  Here is how we plan to help!  Based on what you have shared with me, it looks like you may have a viral upper respiratory infection or a "common cold".  Colds are caused by a large number of viruses; however, rhinovirus is the most common cause.   Symptoms of the common cold vary from person to person, with common symptoms including sore throat, cough, and malaise.  A low-grade fever of 100.4 may present, but is often uncommon.  Symptoms vary however, and are closely related to a person's age or underlying illnesses.  The most common symptoms associated with the common cold are nasal discharge or congestion, cough, sneezing, headache and pressure in the ears and face.  Cold symptoms usually persist for about 3 to 10 days, but can last up to 2 weeks.  It is important to know that colds do not cause serious illness or complications in most cases.    The common cold is transmitted from person to person, with the most common method of transmission being a person's hands.  The virus is able to live on the skin and can infect other persons for up to 2 hours after direct contact.  Also, colds are transmitted when someone coughs or sneezes; thus, it is important to cover the mouth to reduce this risk.  To keep the spread of the common cold at bay, good hand hygiene is very important.  This is an infection that is most likely caused by a virus. There are no specific treatments for the common cold other than to help you with the symptoms until the infection runs its course.    For nasal congestion, you may use an oral decongestants such as Mucinex D or if you have glaucoma or high blood pressure use plain Mucinex.  Saline nasal spray or nasal drops can help and can safely be used as often as needed for congestion.  For your congestion, I have prescribed Fluticasone nasal spray one spray in each nostril twice a day. This is safe while nursing.  If you do not have a history of  heart disease, hypertension, diabetes or thyroid disease, prostate/bladder issues or glaucoma, you may also use Sudafed to treat nasal congestion.  It is highly recommended that you consult with a pharmacist or your primary care physician to ensure this medication is safe for you to take.     If you have a cough, you may use cough suppressants such as Delsym and Robitussin.  If you have glaucoma or high blood pressure, you can also use Coricidin HBP.  These are also safe while nursing.   If you have a sore or scratchy throat, use a saltwater gargle-  to  teaspoon of salt dissolved in a 4-ounce to 8-ounce glass of warm water.  Gargle the solution for approximately 15-30 seconds and then spit.  It is important not to swallow the solution.  You can also use throat lozenges/cough drops and Chloraseptic spray to help with throat pain or discomfort.  Warm or cold liquids can also be helpful in relieving throat pain.  For headache, pain or general discomfort, you can use Ibuprofen or Tylenol as directed.   Some authorities believe that zinc sprays or the use of Echinacea may shorten the course of your symptoms.   HOME CARE . Only take medications as instructed by your medical team. . Be sure to drink plenty of fluids. Water is fine as well as fruit  juices, sodas and electrolyte beverages. You may want to stay away from caffeine or alcohol. If you are nauseated, try taking small sips of liquids. How do you know if you are getting enough fluid? Your urine should be a pale yellow or almost colorless. . Get rest. . Taking a steamy shower or using a humidifier may help nasal congestion and ease sore throat pain. You can place a towel over your head and breathe in the steam from hot water coming from a faucet. . Using a saline nasal spray works much the same way. . Cough drops, hard candies and sore throat lozenges may ease your cough. . Avoid close contacts especially the very young and the elderly . Cover  your mouth if you cough or sneeze . Always remember to wash your hands.   GET HELP RIGHT AWAY IF: . You develop worsening fever. . If your symptoms do not improve within 10 days . You become short of breath. . You develop yellow or green discharge from your nose over 3 days. . You have coughing fits . You develop a severe head ache or visual changes. . You develop shortness of breath or difficulty breathing. . Your symptoms persist after you have completed your treatment plan  MAKE SURE YOU   Understand these instructions.  Will watch your condition.  Will get help right away if you are not doing well or get worse.  Your e-visit answers were reviewed by a board certified advanced clinical practitioner to complete your personal care plan. Depending upon the condition, your plan could have included both over the counter or prescription medications. Please review your pharmacy choice. If there is a problem, you may call our nursing hot line at and have the prescription routed t o another pharmacy. Your safety is important to [place]. If you have drug allergies check your prescription carefully.   You can use MyChart to ask questions about today's visit, request a non-urgent call back, or ask for a work or school excuse for 24 hours related to this e-Visit. If it has been greater than 24 hours you will need to follow up with your provider, or enter a new e-Visit to address those concerns. You will get an e-mail in the next two days asking about your experience.  I hope that your e-visit has been valuable and will speed your recovery. Thank you for using e-visits.

## 2017-07-28 MED FILL — VALACYCLOVIR HCL 500 MG TAB: 500 | 30 days supply | Qty: 40 | Fill #1

## 2017-09-26 ENCOUNTER — Telehealth: Payer: 59 | Admitting: Nurse Practitioner

## 2017-09-26 ENCOUNTER — Ambulatory Visit: Payer: Self-pay | Admitting: Family Medicine

## 2017-09-26 VITALS — BP 130/75 | HR 90 | Temp 97.7°F | Resp 16 | Wt 228.2 lb

## 2017-09-26 DIAGNOSIS — J0100 Acute maxillary sinusitis, unspecified: Secondary | ICD-10-CM

## 2017-09-26 DIAGNOSIS — J329 Chronic sinusitis, unspecified: Secondary | ICD-10-CM

## 2017-09-26 DIAGNOSIS — R110 Nausea: Secondary | ICD-10-CM

## 2017-09-26 DIAGNOSIS — J302 Other seasonal allergic rhinitis: Secondary | ICD-10-CM

## 2017-09-26 MED ORDER — FLUTICASONE PROPIONATE 50 MCG/ACT NA SUSP: 2.0000 | Freq: Every day | NASAL | 0 refills | Status: DC

## 2017-09-26 MED ORDER — ONDANSETRON HCL 4 MG PO TABS: 4.0000 mg | ORAL_TABLET | Freq: Three times a day (TID) | ORAL | 0 refills | Status: DC | PRN

## 2017-09-26 MED ORDER — DOXYCYCLINE HYCLATE 100 MG PO TABS: 100.0000 mg | ORAL_TABLET | Freq: Two times a day (BID) | ORAL | 0 refills | Status: DC

## 2017-09-26 MED ORDER — LEVOCETIRIZINE DIHYDROCHLORIDE 5 MG PO TABS: 5.0000 mg | ORAL_TABLET | Freq: Every evening | ORAL | 0 refills | Status: DC

## 2017-09-26 MED ORDER — IPRATROPIUM BROMIDE 0.06 % NA SOLN: 2.0000 | Freq: Three times a day (TID) | NASAL | 0 refills | Status: DC

## 2017-09-26 MED FILL — IPRATROPIUM 0.06% SPRAY: 0.06 | 13 days supply | Qty: 15 | Fill #0

## 2017-09-26 MED FILL — LEVOCETIRIZINE 5 MG TABLET: 5 | 30 days supply | Qty: 30 | Fill #0

## 2017-09-26 MED FILL — FLUTICASONE PROP 50 MCG SPR: 50 | 30 days supply | Qty: 16 | Fill #0

## 2017-09-26 MED FILL — ONDANSETRON HCL 4 MG TABLET: 4 | 2 days supply | Qty: 6 | Fill #0

## 2017-09-26 MED FILL — DOXYCYCLINE HYCLATE 100 MG: 100 | 7 days supply | Qty: 14 | Fill #0

## 2017-09-26 NOTE — Progress Notes (Signed)
Jefferson FuelBillie L Aja is a 37 y.o. female who presents with left sided facial and ear pain that has progressively become worse over the last 5-7 days. Patient reports utilizing multiple OTC medications to include Nyquil and Theraflu without relief.   Review of Systems  Constitutional: Negative for chills, fever and malaise/fatigue.  HENT: Positive for congestion, sinus pain and sore throat.   Eyes: Negative.   Respiratory: Positive for cough.   Cardiovascular: Negative.   Gastrointestinal: Negative.   Genitourinary: Negative.   Musculoskeletal: Negative.   Skin: Negative.   Neurological: Negative.   Endo/Heme/Allergies: Negative.   Psychiatric/Behavioral: Negative.     O: Vitals:   09/26/17 1610  BP: 130/75  Pulse: 90  Resp: 16  Temp: 97.7 F (36.5 C)  SpO2: 97%   Physical Exam  Constitutional: She is oriented to person, place, and time. Vital signs are normal. She appears well-developed and well-nourished. She is active.  HENT:  Head: Normocephalic.  Right Ear: Hearing, tympanic membrane, external ear and ear canal normal.  Left Ear: Hearing, external ear and ear canal normal. Tympanic membrane is injected and erythematous. A middle ear effusion is present.  Nose: Rhinorrhea present.  Mouth/Throat: Uvula is midline and oropharynx is clear and moist. Tonsils are 2+ on the right. Tonsils are 3+ on the left. No tonsillar exudate.  Eyes: Pupils are equal, round, and reactive to light.  Neck: Normal range of motion.  Cardiovascular: Normal rate, regular rhythm, normal heart sounds and intact distal pulses.  Pulmonary/Chest: Effort normal and breath sounds normal.  Abdominal: Soft. Bowel sounds are normal.  Musculoskeletal: Normal range of motion.  Lymphadenopathy:    She has cervical adenopathy.  Neurological: She is alert and oriented to person, place, and time.  Skin: Skin is warm and dry.  Vitals reviewed.   A: 1. Sinusitis, unspecified chronicity, unspecified location    2. Seasonal allergic rhinitis, unspecified trigger   3. Nausea    P: Reviewed extensively medication use and indications and side effects. Discussed diagnosis and treatment. Patient verbalized understanding and agrees with POC at the time of the visit. 1. Sinusitis, unspecified chronicity, unspecified location - ipratropium (ATROVENT) 0.06 % nasal spray; Place 2 sprays into both nostrils 3 (three) times daily.  2. Seasonal allergic rhinitis, unspecified trigger - levocetirizine (XYZAL) 5 MG tablet; Take 1 tablet (5 mg total) by mouth every evening. - fluticasone (FLONASE) 50 MCG/ACT nasal spray; Place 2 sprays into both nostrils daily.  3. Nausea Associated with medication use on empty stomach- discussed need to eat and drink full glass of water with medication use - ondansetron (ZOFRAN) 4 MG tablet; Take 1 tablet (4 mg total) by mouth every 8 (eight) hours as needed for nausea or vomiting.

## 2017-09-26 NOTE — Patient Instructions (Addendum)
Sinusitis, Adult Sinusitis is soreness and inflammation of your sinuses. Sinuses are hollow spaces in the bones around your face. They are located:  Around your eyes.  In the middle of your forehead.  Behind your nose.  In your cheekbones.  Your sinuses and nasal passages are lined with a stringy fluid (mucus). Mucus normally drains out of your sinuses. When your nasal tissues get inflamed or swollen, the mucus can get trapped or blocked so air cannot flow through your sinuses. This lets bacteria, viruses, and funguses grow, and that leads to infection. Follow these instructions at home: Medicines  Take, use, or apply over-the-counter and prescription medicines only as told by your doctor. These may include nasal sprays.  If you were prescribed an antibiotic medicine, take it as told by your doctor. Do not stop taking the antibiotic even if you start to feel better. Hydrate and Humidify  Drink enough water to keep your pee (urine) clear or pale yellow.  Use a cool mist humidifier to keep the humidity level in your home above 50%.  Breathe in steam for 10-15 minutes, 3-4 times a day or as told by your doctor. You can do this in the bathroom while a hot shower is running.  Try not to spend time in cool or dry air. Rest  Rest as much as possible.  Sleep with your head raised (elevated).  Make sure to get enough sleep each night. General instructions  Put a warm, moist washcloth on your face 3-4 times a day or as told by your doctor. This will help with discomfort.  Wash your hands often with soap and water. If there is no soap and water, use hand sanitizer.  Do not smoke. Avoid being around people who are smoking (secondhand smoke).  Keep all follow-up visits as told by your doctor. This is important. Contact a doctor if:  You have a fever.  Your symptoms get worse.  Your symptoms do not get better within 10 days. Get help right away if:  You have a very bad  headache.  You cannot stop throwing up (vomiting).  You have pain or swelling around your face or eyes.  You have trouble seeing.  You feel confused.  Your neck is stiff.  You have trouble breathing. This information is not intended to replace advice given to you by your health care provider. Make sure you discuss any questions you have with your health care provider. Document Released: 11/13/2007 Document Revised: 01/21/2016 Document Reviewed: 03/22/2015 Elsevier Interactive Patient Education  2018 [employer]  Allergic Rhinitis, Adult Allergic rhinitis is an allergic reaction that affects the mucous membrane inside the nose. It causes sneezing, a runny or stuffy nose, and the feeling of mucus going down the back of the throat (postnasal drip). Allergic rhinitis can be mild to severe. There are two types of allergic rhinitis:  Seasonal. This type is also called hay fever. It happens only during certain seasons.  Perennial. This type can happen at any time of the year.  What are the causes? This condition happens when the body's defense system (immune system) responds to certain harmless substances called allergens as though they were germs.  Seasonal allergic rhinitis is triggered by pollen, which can come from grasses, trees, and weeds. Perennial allergic rhinitis may be caused by:  House dust mites.  Pet dander.  Mold spores.  What are the signs or symptoms? Symptoms of this condition include:  Sneezing.  Runny or stuffy nose (nasal congestion).    Postnasal drip.  Itchy nose.  Tearing of the eyes.  Trouble sleeping.  Daytime sleepiness.  How is this diagnosed? This condition may be diagnosed based on:  Your medical history.  A physical exam.  Tests to check for related conditions, such as: ? Asthma. ? Pink eye. ? Ear infection. ? Upper respiratory infection.  Tests to find out which allergens trigger your symptoms. These may include skin or blood  tests.  How is this treated? There is no cure for this condition, but treatment can help control symptoms. Treatment may include:  Taking medicines that block allergy symptoms, such as antihistamines. Medicine may be given as a shot, nasal spray, or pill.  Avoiding the allergen.  Desensitization. This treatment involves getting ongoing shots until your body becomes less sensitive to the allergen. This treatment may be done if other treatments do not help.  If taking medicine and avoiding the allergen does not work, new, stronger medicines may be prescribed.  Follow these instructions at home:  Find out what you are allergic to. Common allergens include smoke, dust, and pollen.  Avoid the things you are allergic to. These are some things you can do to help avoid allergens: ? Replace carpet with wood, tile, or vinyl flooring. Carpet can trap dander and dust. ? Do not smoke. Do not allow smoking in your home. ? Change your heating and air conditioning filter at least once a month. ? During allergy season:  Keep windows closed as much as possible.  Plan outdoor activities when pollen counts are lowest. This is usually during the evening hours.  When coming indoors, change clothing and shower before sitting on furniture or bedding.  Take over-the-counter and prescription medicines only as told by your health care provider.  Keep all follow-up visits as told by your health care provider. This is important. Contact a health care provider if:  You have a fever.  You develop a persistent cough.  You make whistling sounds when you breathe (you wheeze).  Your symptoms interfere with your normal daily activities. Get help right away if:  You have shortness of breath. Summary  This condition can be managed by taking medicines as directed and avoiding allergens.  Contact your health care provider if you develop a persistent cough or fever.  During allergy season, keep windows  closed as much as possible. This information is not intended to replace advice given to you by your health care provider. Make sure you discuss any questions you have with your health care provider. Document Released: 02/19/2001 Document Revised: 07/04/2016 Document Reviewed: 07/04/2016 Elsevier Interactive Patient Education  2018 [employer]  

## 2017-09-26 NOTE — Progress Notes (Signed)

## 2017-10-21 ENCOUNTER — Ambulatory Visit (INDEPENDENT_AMBULATORY_CARE_PROVIDER_SITE_OTHER): Payer: 59 | Admitting: Urgent Care

## 2017-10-21 ENCOUNTER — Encounter: Payer: Self-pay | Admitting: Urgent Care

## 2017-10-21 VITALS — BP 139/86 | HR 83 | Temp 97.6°F | Resp 17 | Ht 67.0 in | Wt 229.0 lb

## 2017-10-21 DIAGNOSIS — J019 Acute sinusitis, unspecified: Secondary | ICD-10-CM | POA: Diagnosis not present

## 2017-10-21 DIAGNOSIS — J3089 Other allergic rhinitis: Secondary | ICD-10-CM | POA: Diagnosis not present

## 2017-10-21 DIAGNOSIS — R0602 Shortness of breath: Secondary | ICD-10-CM | POA: Diagnosis not present

## 2017-10-21 DIAGNOSIS — J3489 Other specified disorders of nose and nasal sinuses: Secondary | ICD-10-CM | POA: Diagnosis not present

## 2017-10-21 DIAGNOSIS — J9801 Acute bronchospasm: Secondary | ICD-10-CM

## 2017-10-21 MED ORDER — BENZONATATE 100 MG PO CAPS: 100.0000 mg | ORAL_CAPSULE | Freq: Three times a day (TID) | ORAL | 0 refills | Status: DC | PRN

## 2017-10-21 MED ORDER — ALBUTEROL SULFATE 108 (90 BASE) MCG/ACT IN AEPB: 1.0000 | INHALATION_SPRAY | Freq: Three times a day (TID) | RESPIRATORY_TRACT | 1 refills | Status: DC | PRN

## 2017-10-21 MED ORDER — PSEUDOEPHEDRINE HCL ER 120 MG PO TB12: 120.0000 mg | ORAL_TABLET | Freq: Two times a day (BID) | ORAL | 3 refills | Status: DC

## 2017-10-21 MED ORDER — METHYLPREDNISOLONE ACETATE 80 MG/ML IJ SUSP
80.0000 mg | Freq: Once | INTRAMUSCULAR | Status: AC
  Administered 2017-10-21: 80 mg via INTRAMUSCULAR

## 2017-10-21 MED ORDER — HYDROCODONE-HOMATROPINE 5-1.5 MG/5ML PO SYRP: 5.0000 mL | ORAL_SOLUTION | Freq: Every evening | ORAL | 0 refills | Status: DC | PRN

## 2017-10-21 MED FILL — BENZONATATE 100 MG CAP: 100 | 10 days supply | Qty: 60 | Fill #0

## 2017-10-21 MED FILL — HYDROCODONE-HOMATROPINE SYR: 5-1.5 | 20 days supply | Qty: 100 | Fill #0

## 2017-10-21 NOTE — Progress Notes (Signed)
    MRN: [id number] DOB: March 26, 1981  Subjective:   Teresa Harvey is a 37 y.o. female presenting for 3 week history of persistent sinus congestion, sinus pressure, sinus headaches, bilateral ear fullness, productive cough that elicits bronchospasms, shob. She has been using Xyzal, Flonase, Atrovent with minimal relief.  She was also prescribed doxycycline which she completed and had relief for about 2 to 3 days.  Denies history of asthma. Denies history of diabetes. Denies family history of lung disorders, sarcoidosis, lung cancer. Denies fever, confusion, dizziness, sore throat, chest pain, n/v, abdominal pain, rashes, hematuria.   Malcolm has a current medication list which includes the following prescription(s): cromolyn, fluticasone, ibuprofen, ondansetron, and valacyclovir. Also is allergic to penicillins and latex.  Teresa Harvey  has a past medical history of Cervical dysplasia (2004), HSV infection, gonorrhea, Medical history non-contributory, Pregnancy induced hypertension, and Vaginal Pap smear, abnormal. Also  has a past surgical history that includes Colposcopy; Esophagogastroduodenoscopy; and Cesarean section (N/A, 03/21/2017). Her family history includes Bipolar disorder in her mother; Cerebral palsy in her maternal grandmother; Heart attack in her father; Heart disease in her father; Hypertension in her maternal grandmother.   Objective:   Vitals: BP 139/86   Pulse 83   Temp 97.6 F (36.4 C) (Oral)   Resp 17   Ht  (1.702 m)   Wt 229 lb (103.9 kg)   SpO2 98%   BMI 35.87 kg/m   Physical Exam  Constitutional: She is oriented to person, place, and time. She appears well-developed and well-nourished.  HENT:  Mild bilateral maxillary sinus tenderness.  Mucosal edema with rhinorrhea.  TMs clear without erythema.  Throat with significant streaks of postnasal drainage.  Eyes: Right eye exhibits no discharge. Left eye exhibits no discharge. No scleral icterus.  Neck: Normal range of  motion. Neck supple.  Cardiovascular: Normal rate, regular rhythm and intact distal pulses. Exam reveals no gallop and no friction rub.  No murmur heard. Pulmonary/Chest: No respiratory distress. She has no wheezes. She has no rales.  Neurological: She is alert and oriented to person, place, and time.  Skin: Skin is warm and dry.  Psychiatric: She has a normal mood and affect.    Assessment and Plan :   Bronchospasm  Non-seasonal allergic rhinitis due to other allergic trigger  Acute sinusitis, recurrence not specified, unspecified location - Plan: methylPREDNISolone acetate (DEPO-MEDROL) injection 80 mg  Sinus pressure  Shortness of breath  IM Depo-Medrol in clinic today.  Maintain allergy medications.  Add pseudoephedrine for nasal congestion as needed. Will hold off on antibiotics unless she has no improvement. Albuterol for shob, cough suppression medications provided.   Wallis Bamberg, PA-C Primary Care at [facility] [phone] 10/21/2017  4:57 PM

## 2017-10-21 NOTE — Patient Instructions (Addendum)
Bronchospasm, Adult Bronchospasm is when airways in the lungs get smaller. When this happens, it can be hard to breathe. You may cough. You may also make a whistling sound when you breathe (wheeze). Follow these instructions at home: Medicines  Take over-the-counter and prescription medicines only as told by your doctor.  If you need to use an inhaler or nebulizer to take your medicine, ask your doctor how to use it.  If you were given a spacer, always use it with your inhaler. Lifestyle  Change your heating and air conditioning filter. Do this at least once a month.  Try not to use fireplaces and wood stoves.  Do not  smoke. Do not  allow smoking in your home.  Try not to use things that have a strong smell, like perfume.  Get rid of pests (such as roaches and mice) and their poop.  Remove any mold from your home.  Keep your house clean. Get rid of dust.  Use cleaning products that have no smell.  Replace carpet with wood, tile, or vinyl flooring.  Use allergy-proof pillows, mattress covers, and box spring covers.  Wash bed sheets and blankets every week. Use hot water. Dry them in a dryer.  Use blankets that are made of polyester or cotton.  Wash your hands often.  Keep pets out of your bedroom.  When you exercise, try not to breathe in cold air. General instructions  Have a plan for getting medical care. Know these things: ? When to call your doctor. ? When to call local emergency services (911 in the [place] ? Where to go in an emergency.  Stay up to date on your shots (immunizations).  When you have an episode: ? Stay calm. ? Relax. ? Breathe slowly. Contact a doctor if:  Your muscles ache.  Your chest hurts.  The color of the mucus you cough up (sputum) changes from clear or white to yellow, green, gray, or bloody.  The mucus you cough up gets thicker.  You have a fever. Get help right away if:  The whistling sound gets worse, even after you  take your medicines.  Your coughing gets worse.  You find it even harder to breathe.  Your chest hurts very much. Summary  Bronchospasm is when airways in the lungs get smaller.  When this happens, it can be hard to breathe. You may cough. You may also make a whistling sound when you breathe.  Stay away from things that cause you to have episodes. These include smoke or dust. This information is not intended to replace advice given to you by your health care provider. Make sure you discuss any questions you have with your health care provider. Document Released: 03/24/2009 Document Revised: 05/30/2016 Document Reviewed: 05/30/2016 Elsevier Interactive Patient Education  2017 [employer] Inc.     Allergic Rhinitis, Adult Allergic rhinitis is an allergic reaction that affects the mucous membrane inside the nose. It causes sneezing, a runny or stuffy nose, and the feeling of mucus going down the back of the throat (postnasal drip). Allergic rhinitis can be mild to severe. There are two types of allergic rhinitis:  Seasonal. This type is also called hay fever. It happens only during certain seasons.  Perennial. This type can happen at any time of the year.  What are the causes? This condition happens when the body's defense system (immune system) responds to certain harmless substances called allergens as though they were germs.  Seasonal allergic rhinitis is triggered by pollen,  which can come from grasses, trees, and weeds. Perennial allergic rhinitis may be caused by:  House dust mites.  Pet dander.  Mold spores.  What are the signs or symptoms? Symptoms of this condition include:  Sneezing.  Runny or stuffy nose (nasal congestion).  Postnasal drip.  Itchy nose.  Tearing of the eyes.  Trouble sleeping.  Daytime sleepiness.  How is this diagnosed? This condition may be diagnosed based on:  Your medical history.  A physical exam.  Tests to check for related  conditions, such as: ? Asthma. ? Pink eye. ? Ear infection. ? Upper respiratory infection.  Tests to find out which allergens trigger your symptoms. These may include skin or blood tests.  How is this treated? There is no cure for this condition, but treatment can help control symptoms. Treatment may include:  Taking medicines that block allergy symptoms, such as antihistamines. Medicine may be given as a shot, nasal spray, or pill.  Avoiding the allergen.  Desensitization. This treatment involves getting ongoing shots until your body becomes less sensitive to the allergen. This treatment may be done if other treatments do not help.  If taking medicine and avoiding the allergen does not work, new, stronger medicines may be prescribed.  Follow these instructions at home:  Find out what you are allergic to. Common allergens include smoke, dust, and pollen.  Avoid the things you are allergic to. These are some things you can do to help avoid allergens: ? Replace carpet with wood, tile, or vinyl flooring. Carpet can trap dander and dust. ? Do not smoke. Do not allow smoking in your home. ? Change your heating and air conditioning filter at least once a month. ? During allergy season:  Keep windows closed as much as possible.  Plan outdoor activities when pollen counts are lowest. This is usually during the evening hours.  When coming indoors, change clothing and shower before sitting on furniture or bedding.  Take over-the-counter and prescription medicines only as told by your health care provider.  Keep all follow-up visits as told by your health care provider. This is important. Contact a health care provider if:  You have a fever.  You develop a persistent cough.  You make whistling sounds when you breathe (you wheeze).  Your symptoms interfere with your normal daily activities. Get help right away if:  You have shortness of breath. Summary  This condition can be  managed by taking medicines as directed and avoiding allergens.  Contact your health care provider if you develop a persistent cough or fever.  During allergy season, keep windows closed as much as possible. This information is not intended to replace advice given to you by your health care provider. Make sure you discuss any questions you have with your health care provider. Document Released: 02/19/2001 Document Revised: 07/04/2016 Document Reviewed: 07/04/2016 Elsevier Interactive Patient Education  2018 [employer].     IF you received an x-ray today, you will receive an invoice from [facility]. Please contact [facility] at [phone] with questions or concerns regarding your invoice.   IF you received labwork today, you will receive an invoice from [facility]. Please contact [facility] at [phone] with questions or concerns regarding your invoice.   Our billing staff will not be able to assist you with questions regarding bills from these companies.  You will be contacted with the lab results as soon as they are available. The fastest way to get your results is to activate your My  Chart account. Instructions are located on the last page of this paperwork. If you have not heard from [place] regarding the results in 2 weeks, please contact this office.

## 2017-10-23 ENCOUNTER — Encounter: Payer: Self-pay | Admitting: Urgent Care

## 2017-10-24 ENCOUNTER — Encounter: Payer: Self-pay | Admitting: Urgent Care

## 2017-10-24 DIAGNOSIS — J3089 Other allergic rhinitis: Secondary | ICD-10-CM

## 2017-11-27 ENCOUNTER — Ambulatory Visit (INDEPENDENT_AMBULATORY_CARE_PROVIDER_SITE_OTHER): Payer: 59 | Admitting: Allergy

## 2017-11-27 ENCOUNTER — Encounter: Payer: Self-pay | Admitting: Allergy

## 2017-11-27 VITALS — BP 116/74 | HR 72 | Temp 98.8°F | Resp 16 | Ht 66.0 in | Wt 225.2 lb

## 2017-11-27 DIAGNOSIS — L2089 Other atopic dermatitis: Secondary | ICD-10-CM

## 2017-11-27 DIAGNOSIS — L568 Other specified acute skin changes due to ultraviolet radiation: Secondary | ICD-10-CM

## 2017-11-27 DIAGNOSIS — H1010 Acute atopic conjunctivitis, unspecified eye: Secondary | ICD-10-CM | POA: Diagnosis not present

## 2017-11-27 DIAGNOSIS — J4520 Mild intermittent asthma, uncomplicated: Secondary | ICD-10-CM

## 2017-11-27 DIAGNOSIS — J309 Allergic rhinitis, unspecified: Secondary | ICD-10-CM | POA: Diagnosis not present

## 2017-11-27 MED ORDER — FLUTICASONE PROPIONATE 93 MCG/ACT NA EXHU: 2.0000 | INHALANT_SUSPENSION | Freq: Two times a day (BID) | NASAL | 5 refills | Status: DC

## 2017-11-27 MED ORDER — MONTELUKAST SODIUM 10 MG PO TABS: 10.0000 mg | ORAL_TABLET | Freq: Every day | ORAL | 5 refills | Status: DC

## 2017-11-27 MED ORDER — ALBUTEROL SULFATE HFA 108 (90 BASE) MCG/ACT IN AERS
2.0000 | INHALATION_SPRAY | RESPIRATORY_TRACT | 1 refills | Status: DC | PRN
Start: 2017-11-27 — End: 2018-07-20

## 2017-11-27 MED ORDER — AZELASTINE HCL 0.1 % NA SOLN: 2.0000 | Freq: Two times a day (BID) | NASAL | 5 refills | Status: DC

## 2017-11-27 MED ORDER — TRIAMCINOLONE ACETONIDE 0.1 % EX OINT: 1.0000 "application " | TOPICAL_OINTMENT | Freq: Two times a day (BID) | CUTANEOUS | 3 refills | Status: DC

## 2017-11-27 MED ORDER — OLOPATADINE HCL 0.2 % OP SOLN: 1.0000 [drp] | Freq: Every day | OPHTHALMIC | 5 refills | Status: DC

## 2017-11-27 NOTE — Progress Notes (Signed)
New Patient Note  RE: Teresa Harvey MRN: [id number] DOB: 1980-08-14 Date of Office Visit: 11/27/2017  Referring provider: Jaynee Eagles, PA-C Primary care provider: Jaynee Eagles, PA-C  Chief Complaint: allergies  History of present illness: Teresa Harvey is a 37 y.o. female presenting today for consultation for allergic rhinitis.    She states she has always had allergies but feels this year is a lot worse.   She reports symptoms of throat itch, watery eyes, nasal congestion, nasal drainage with throat clearing and cough.   Symptoms occur year-round but are worse during spring going into summer.  She reports she has tried both claritin and Human resources officer that did not work for her.  She has not tried zyrtec.  Flonase she reports helps a little bit but is very temporary relief.  She has also tried nasonex in the past.  Has never any eye drops before.  Has never had any allergy testing before.    She states for 3 weeks in May 2019 she had a sinus infection where she reports worsening allergy symptoms as well a sinus pain and pressure.  She saw her PCP for these symptoms and received an steroid injection.  PCP also prescribed an albuterol inhaler as she was complaining of cough and SOB with the sinus symptoms.   She does feel she had wheezing as well.  She states she used the albuterol several times and does feel it helped when she was SOB.  She reports having a lot of sinus migraines.  She has no diagnosis of asthma.    She has history of eczema with problem areas mainly being her upper back.  She states she has an "eczema lotion" that she uses when she is having a flare up.    She states she is "allergic to heat and sun".  She states she will break out in itchy rash that looks like "little cluster of red bumps".  She states she wears sunscreen and has medical grade tinting on her car.   If she develops the rash she normally will apply aloe.    Denies any history of food allergy.   Review of  systems: Review of Systems  Constitutional: Negative for chills, fever and malaise/fatigue.  HENT: Positive for congestion. Negative for ear discharge, ear pain, nosebleeds, sinus pain and sore throat.   Eyes: Negative for pain, discharge and redness.  Respiratory: Negative for cough, shortness of breath and wheezing.   Cardiovascular: Negative for chest pain.  Gastrointestinal: Negative for abdominal pain, constipation, diarrhea, heartburn, nausea and vomiting.  Musculoskeletal: Negative for joint pain.  Skin: Positive for itching and rash.  Neurological: Negative for headaches.    All other systems negative unless noted above in HPI  Past medical history: Past Medical History:  Diagnosis Date  . Cervical dysplasia 2004   no treatment, normal paps after  . Eczema   . HSV infection   . Hx of gonorrhea   . Medical history non-contributory   . Pregnancy induced hypertension   . Vaginal Pap smear, abnormal     Past surgical history: Past Surgical History:  Procedure Laterality Date  . CESAREAN SECTION N/A 03/21/2017   Procedure: Primary CESAREAN SECTION;  Surgeon: Azucena Fallen, MD;  Location: [facility];  Service: Obstetrics;  Laterality: N/A;  EDD: 03/28/17 Allergy: Penicillin   . COLPOSCOPY    . ESOPHAGOGASTRODUODENOSCOPY      Family history:  Family History  Problem Relation Age of Onset  . Heart  disease Father   . Heart attack Father   . Eczema Father   . Hypertension Maternal Grandmother   . Cerebral palsy Maternal Grandmother   . Bipolar disorder Mother   . Asthma Mother   . Allergic rhinitis Neg Hx   . Angioedema Neg Hx   . Urticaria Neg Hx     Social history: Lives in a townhome with carpeting with electric heating and central cooling.  Dog in the home.  No concern for water damage, mildew or roaches in the home.  She is a Medical illustrator.  Denies any smoking history.    Medication List: Allergies as of 11/27/2017      Reactions    Penicillins Swelling   REACTION: tongue swealling Has patient had a PCN reaction causing immediate rash, facial/tongue/throat swelling, SOB or lightheadedness with hypotension: yes Has patient had a PCN reaction causing severe rash involving mucus membranes or skin necrosis: no Has patient had a PCN reaction that required hospitalization: yes dr's office visit Has patient had a PCN reaction occurring within the last 10 years: no If all of the above answers are "NO", then may proceed with Cephalosporin use.   Latex Itching, Rash   Redness and itching with latex exposure      Medication List        Accurate as of 11/27/17 11:59 PM. Always use your most recent med list.          albuterol 108 (90 Base) MCG/ACT inhaler Commonly known as:  VENTOLIN HFA Inhale 2 puffs into the lungs every 4 (four) hours as needed for wheezing or shortness of breath.   azelastine 0.1 % nasal spray Commonly known as:  ASTELIN Place 2 sprays into both nostrils 2 (two) times daily. Use in each nostril as directed   Fluticasone Propionate 93 MCG/ACT Exhu Commonly known as:  XHANCE Place 2 sprays into the nose 2 (two) times daily.   ibuprofen 600 MG tablet Commonly known as:  ADVIL,MOTRIN Take 1 tablet (600 mg total) by mouth every 6 (six) hours.   montelukast 10 MG tablet Commonly known as:  SINGULAIR Take 1 tablet (10 mg total) by mouth at bedtime.   Olopatadine HCl 0.2 % Soln Commonly known as:  PATADAY Place 1 drop into both eyes daily.   triamcinolone ointment 0.1 % Commonly known as:  KENALOG Apply 1 application topically 2 (two) times daily.   valACYclovir 500 MG tablet Commonly known as:  VALTREX Take 500 mg by mouth daily.       Known medication allergies: Allergies  Allergen Reactions  . Penicillins Swelling    REACTION: tongue swealling Has patient had a PCN reaction causing immediate rash, facial/tongue/throat swelling, SOB or lightheadedness with hypotension: yes Has  patient had a PCN reaction causing severe rash involving mucus membranes or skin necrosis: no Has patient had a PCN reaction that required hospitalization: yes dr's office visit Has patient had a PCN reaction occurring within the last 10 years: no If all of the above answers are "NO", then may proceed with Cephalosporin use.   . Latex Itching and Rash    Redness and itching with latex exposure     Physical examination: Blood pressure 116/74, pulse 72, temperature 98.8 F (37.1 C), temperature source Oral, resp. rate 16, height 5' 6"  (1.676 m), weight 225 lb 3.2 oz (102.2 kg), unknown if currently breastfeeding.  General: Alert, interactive, in no acute distress. HEENT: PERRLA, TMs pearly gray, turbinates moderately edematous with clear discharge, post-pharynx  non erythematous. Neck: Supple without lymphadenopathy. Lungs: Clear to auscultation without wheezing, rhonchi or rales. {no increased work of breathing. CV: Normal S1, S2 without murmurs. Abdomen: Nondistended, nontender. Skin: Warm and dry, without lesions or rashes. Extremities:  No clubbing, cyanosis or edema. Neuro:   Grossly intact.  Diagnositics/Labs:  Spirometry: FEV1: 2.63L  86%, FVC: 3.07L 84%, ratio consistent with nonobstructive pattern  Allergy testing: environmental allergy skin prick testing is positive to bahia, pine, penicillium, curvularia, dust mites, cockroach Intradermal testing is positive to [place], weed mix Allergy testing results were read and interpreted by provider, documented by clinical staff.   Assessment and plan:   Allergic rhinoconjunctivitis   -Environmental allergy skin testing today is positive to tree pollen, grass pollen, weed pollen, molds, dust mites, cockroach   -Allergen avoidance measures provided and discussed   -Try use of OTC cetirizine 10 mg or Xyzal 81m daily   - Start montelukast 10 mg daily-take at bedtime   -For itchy/watery/red eyes use Pazeo or Pataday 1 drop each eye  daily as needed    -Recommend use of nasal saline rinses prior to using any medicated nasal sprays.  Kit provided today.     - for nasal drainage start nasal antihistamine, Astelin 2 sprays each nostril twice a day    - for nasal congestion recommend use of Xhance device which is flonase that has better deposition of the steroid spray into your sinuses.  Use 2 sprays each nostril twice a day   -allergen immunotherapy discussed today including protocol, benefits and risk.  Informational handout provided.  If interested in this therapuetic option you can check with your insurance carrier for coverage.  Let [place] if you would like to proceed with this option.   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 Pat dry.  Immediately apply the below cream prescribed to flared,red, dry patchy areas only. Wait couple minutes and then apply emollients like Eucerin or Lubriderm or Cetaphil or Aquaphor or Vaseline twice a day all over.   To affected areas on the body (below the face and neck), apply: . Triamcinolone 0.1 % ointment twice a day as needed. . With ointments be careful to avoid the armpits and groin area.  Reactive airway   - cough, shortness of breath and wheezing during respiratory illness that did improve with use of albuterol   - have access to albuterol inhaler 2 puffs every 4-6 hours as needed for cough/wheeze/shortness of breath/chest tightness.  May use 15-20 minutes prior to activity.   Monitor frequency of use.   Follow-up  6 months or sooner if needed  I appreciate the  opportunity to take part in Lisia's care. Please do not hesitate to contact me with questions.  Sincerely,   SPrudy Feeler MD Allergy/Immunology [facility]

## 2017-11-27 NOTE — Patient Instructions (Addendum)
Allergic rhinoconjunctivitis   -Environmental allergy skin testing today is positive to tree pollen, grass pollen, weed pollen, molds, dust mites, cockroach   -Allergen avoidance measures provided and discussed   -Try use of OTC cetirizine 10 mg or Xyzal 60m daily   -Start montelukast 10 mg daily-take at bedtime   -For itchy/watery/red eyes use Pazeo or Pataday 1 drop each eye daily as needed    -Recommend use of nasal saline rinses prior to using any medicated nasal sprays.  Kit provided today.     - for nasal drainage start nasal antihistamine, Astelin 2 sprays each nostril twice a day    - for nasal congestion recommend use of Xhance device which is flonase that has better deposition of the steroid spray into your sinuses.  Use 2 sprays each nostril twice a day   -allergen immunotherapy discussed today including protocol, benefits and risk.  Informational handout provided.  If interested in this therapuetic option you can check with your insurance carrier for coverage.  Let [place] if you would like to proceed with this option.   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 Pat dry.  Immediately apply the below cream prescribed to flared,red, dry patchy areas only. Wait couple minutes and then apply emollients like Eucerin or Lubriderm or Cetaphil or Aquaphor or Vaseline twice a day all over.   To affected areas on the body (below the face and neck), apply: . Triamcinolone 0.1 % ointment twice a day as needed. . With ointments be careful to avoid the armpits and groin  area.  Reactive airway   - cough, shortness of breath and wheezing during respiratory illness that did improve with use of albuterol   - have access to albuterol inhaler 2 puffs every 4-6 hours as needed for cough/wheeze/shortness of breath/chest tightness.  May use 15-20 minutes prior to activity.   Monitor frequency of use.   Follow-up  6 months or sooner if needed

## 2017-11-28 MED FILL — MONTELUKAST SOD 10 MG TAB: 10 | 30 days supply | Qty: 30 | Fill #0

## 2017-11-28 MED FILL — AZELASTINE HCL 137 MCG/SPRA: 137 | 25 days supply | Qty: 30 | Fill #0

## 2017-11-28 MED FILL — VENTOLIN HFA 90 MCG INHALER: 108 (90 BAS | 17 days supply | Qty: 18 | Fill #0

## 2017-11-28 MED FILL — TRIAMCINOLONE 0.1% OINTMENT: 0.1 | 20 days supply | Qty: 80 | Fill #0

## 2017-12-15 MED FILL — VENTOLIN HFA 90 MCG INHALER: 108 (90 BAS | 17 days supply | Qty: 18 | Fill #1

## 2017-12-17 MED FILL — VALACYCLOVIR HCL 500 MG TAB: 500 | 30 days supply | Qty: 40 | Fill #0

## 2018-01-30 ENCOUNTER — Telehealth: Payer: 59 | Admitting: Family

## 2018-01-30 DIAGNOSIS — J028 Acute pharyngitis due to other specified organisms: Secondary | ICD-10-CM | POA: Diagnosis not present

## 2018-01-30 DIAGNOSIS — B9689 Other specified bacterial agents as the cause of diseases classified elsewhere: Secondary | ICD-10-CM

## 2018-01-30 MED ORDER — AZITHROMYCIN 250 MG PO TABS: ORAL_TABLET | ORAL | 0 refills | Status: DC

## 2018-01-30 MED ORDER — ONDANSETRON 4 MG PO TBDP: 4.0000 mg | ORAL_TABLET | Freq: Four times a day (QID) | ORAL | 0 refills | Status: DC | PRN

## 2018-01-30 MED ORDER — BENZONATATE 100 MG PO CAPS: 100.0000 mg | ORAL_CAPSULE | Freq: Three times a day (TID) | ORAL | 0 refills | Status: DC | PRN

## 2018-01-30 MED ORDER — PREDNISONE 5 MG PO TABS: 5.0000 mg | ORAL_TABLET | ORAL | Status: DC

## 2018-01-30 MED FILL — ONDANSETRON ODT 4 MG TABLET: 4 | 5 days supply | Qty: 20 | Fill #0

## 2018-01-30 MED FILL — BENZONATATE 100 MG CAP: 100 | 5 days supply | Qty: 30 | Fill #0

## 2018-01-30 MED FILL — AZITHROMYCIN 250 MG TABS: 250 | 5 days supply | Qty: 6 | Fill #0

## 2018-01-30 MED FILL — predniSONE 5 MG (21) TBPK: 5 | 6 days supply | Qty: 21 | Fill #0

## 2018-01-30 NOTE — Progress Notes (Signed)
Thank you for the details you included in the comment boxes. Those details are very helpful in determining the best course of treatment for you and help us to provide the best care.  We are sorry that you are not feeling well.  Here is how we plan to help!  Based on your presentation I believe you most likely have A cough due to bacteria.  When patients have a fever and a productive cough with a change in color or increased sputum production, we are concerned about bacterial bronchitis.  If left untreated it can progress to pneumonia.  If your symptoms do not improve with your treatment plan it is important that you contact your provider.   I have prescribed Azithromyin 250 mg: two tablets now and then one tablet daily for 4 additonal days    In addition you may use A non-prescription cough medication called Mucinex DM: take 2 tablets every 12 hours. and A prescription cough medication called Tessalon Perles 100mg . You may take 1-2 capsules every 8 hours as needed for your cough.  I have also sent  Zofran 4mg , ODT, take every 6 hours as needed for nausea and vomiting.   Prednisone 5 mg daily for 6 days (see taper instructions below)  Directions for 6 day taper: Day 1: 2 tablets before breakfast, 1 after both lunch & dinner and 2 at bedtime Day 2: 1 tab before breakfast, 1 after both lunch & dinner and 2 at bedtime Day 3: 1 tab at each meal & 1 at bedtime Day 4: 1 tab at breakfast, 1 at lunch, 1 at bedtime Day 5: 1 tab at breakfast & 1 tab at bedtime Day 6: 1 tab at breakfast   From your responses in the eVisit questionnaire you describe inflammation in the upper respiratory tract which is causing a significant cough.  This is commonly called Bronchitis and has four common causes:    Allergies  Viral Infections  Acid Reflux  Bacterial Infection Allergies, viruses and acid reflux are treated by controlling symptoms or eliminating the cause. An example might be a cough caused by taking  certain blood pressure medications. You stop the cough by changing the medication. Another example might be a cough caused by acid reflux. Controlling the reflux helps control the cough.  USE OF BRONCHODILATOR ("RESCUE") INHALERS: There is a risk from using your bronchodilator too frequently.  The risk is that over-reliance on a medication which only relaxes the muscles surrounding the breathing tubes can reduce the effectiveness of medications prescribed to reduce swelling and congestion of the tubes themselves.  Although you feel brief relief from the bronchodilator inhaler, your asthma may actually be worsening with the tubes becoming more swollen and filled with mucus.  This can delay other crucial treatments, such as oral steroid medications. If you need to use a bronchodilator inhaler daily, several times per day, you should discuss this with your provider.  There are probably better treatments that could be used to keep your asthma under control.     HOME CARE . Only take medications as instructed by your medical team. . Complete the entire course of an antibiotic. . Drink plenty of fluids and get plenty of rest. . Avoid close contacts especially the very young and the elderly . Cover your mouth if you cough or cough into your sleeve. . Always remember to wash your hands . A steam or ultrasonic humidifier can help congestion.   GET HELP RIGHT AWAY IF: . You develop  worsening fever. . You become short of breath . You cough up blood. . Your symptoms persist after you have completed your treatment plan MAKE SURE YOU   Understand these instructions.  Will watch your condition.  Will get help right away if you are not doing well or get worse.  Your e-visit answers were reviewed by a board certified advanced clinical practitioner to complete your personal care plan.  Depending on the condition, your plan could have included both over the counter or prescription medications. If there is a  problem please reply  once you have received a response from your provider. Your safety is important to [place].  If you have drug allergies check your prescription carefully.    You can use MyChart to ask questions about today's visit, request a non-urgent call back, or ask for a work or school excuse for 24 hours related to this e-Visit. If it has been greater than 24 hours you will need to follow up with your provider, or enter a new e-Visit to address those concerns. You will get an e-mail in the next two days asking about your experience.  I hope that your e-visit has been valuable and will speed your recovery. Thank you for using e-visits.

## 2018-04-07 MED FILL — VALACYCLOVIR HCL 500 MG TAB: 500 | 30 days supply | Qty: 40 | Fill #1

## 2018-05-15 DIAGNOSIS — Z01419 Encounter for gynecological examination (general) (routine) without abnormal findings: Secondary | ICD-10-CM | POA: Diagnosis not present

## 2018-05-15 DIAGNOSIS — Z6832 Body mass index (BMI) 32.0-32.9, adult: Secondary | ICD-10-CM | POA: Diagnosis not present

## 2018-05-15 MED FILL — VALACYCLOVIR HCL 500 MG TAB: 500 | 30 days supply | Qty: 40 | Fill #0

## 2018-05-15 MED FILL — LEVONOR-ETH ESTRAD 0.1-0.02: 0.1-20 | 84 days supply | Qty: 84 | Fill #0

## 2018-05-19 ENCOUNTER — Ambulatory Visit (INDEPENDENT_AMBULATORY_CARE_PROVIDER_SITE_OTHER): Payer: 59 | Admitting: Family Medicine

## 2018-05-19 ENCOUNTER — Encounter: Payer: Self-pay | Admitting: Family Medicine

## 2018-05-19 VITALS — BP 114/68 | HR 89 | Temp 97.7°F | Ht 66.0 in | Wt 199.2 lb

## 2018-05-19 DIAGNOSIS — J302 Other seasonal allergic rhinitis: Secondary | ICD-10-CM | POA: Diagnosis not present

## 2018-05-19 DIAGNOSIS — L568 Other specified acute skin changes due to ultraviolet radiation: Secondary | ICD-10-CM

## 2018-05-19 NOTE — Patient Instructions (Signed)
It was very nice to see you today!  I think you have a viral infection. This should improve over the next week or so.  Please try taking immodium as needed for diarrhea. You can use zofran as needed for nausea. Please try taking probiotics and make sure are getting plenty of fiber.  Let me know if your symptoms worsen or do not improve in the next 1-2 weeks.   Take care, Dr Jimmey RalphParker

## 2018-05-19 NOTE — Assessment & Plan Note (Signed)
Stable.  Continue sunscreen.  Will be dropping off a form later this week to have medicated tint in her windows.

## 2018-05-19 NOTE — Progress Notes (Signed)
Subjective:  Teresa Harvey is a 37 y.o. female who presents today with a chief complaint of diarrhea and to establish care.   HPI:  Diarrhea, acute problem Started 5 days ago.  Worsened over that time.  Associated symptoms include upper abdominal cramping, and nausea.  No fevers.  Symptoms worse while eating.  Had 2 loose stools yesterday and 1 today.  No vomiting.  Boyfriend has been sick with similar symptoms.  Seasonal allergies, chronic problem, stable Takes Singulair seasonally as needed.  Photosensitive skin Diagnosed as a child.  Reports that she gets severe rash and extreme heat and with UV light exposure.  She has to have medicated tint in her cars due to this.  ROS: Per HPI, otherwise a complete review of systems was negative.   PMH:  The following were reviewed and entered/updated in epic: Past Medical History:  Diagnosis Date  . Cervical dysplasia 2004   no treatment, normal paps after  . Eczema   . HSV infection   . Hx of gonorrhea   . Medical history non-contributory   . Pregnancy induced hypertension   . Vaginal Pap smear, abnormal    Patient Active Problem List   Diagnosis Date Noted  . 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 Seasonal allergies 09/09/2014  . HSV infection    Past Surgical History:  Procedure Laterality Date  . CESAREAN SECTION N/A 03/21/2017   Procedure: Primary CESAREAN SECTION;  Surgeon: Shea Evans, MD;  Location: [facility] BIRTHING SUITES;  Service: Obstetrics;  Laterality: N/A;  EDD: 03/28/17 Allergy: Penicillin   . COLPOSCOPY    . ESOPHAGOGASTRODUODENOSCOPY      Family History  Problem Relation Age of Onset  . Heart disease Father   . Heart attack Father   . Eczema Father   . Hypertension Maternal Grandmother   . Cerebral palsy Maternal Grandmother   . Bipolar disorder Mother   . Asthma Mother   . Allergic rhinitis Neg Hx   . Angioedema Neg Hx   . Urticaria Neg Hx     Medications- reviewed and updated Current  Outpatient Medications  Medication Sig Dispense Refill  . albuterol (VENTOLIN HFA) 108 (90 Base) MCG/ACT inhaler Inhale 2 puffs into the lungs every 4 (four) hours as needed for wheezing or shortness of breath. 1 Inhaler 1  . ibuprofen (ADVIL,MOTRIN) 600 MG tablet Take 1 tablet (600 mg total) by mouth every 6 (six) hours. 30 tablet 0  . montelukast (SINGULAIR) 10 MG tablet Take 1 tablet (10 mg total) by mouth at bedtime. 30 tablet 5  . ondansetron (ZOFRAN ODT) 4 MG disintegrating tablet Take 1 tablet (4 mg total) by mouth every 6 (six) hours as needed for nausea or vomiting. 20 tablet 0  . valACYclovir (VALTREX) 500 MG tablet Take 500 mg by mouth daily.  3   No current facility-administered medications for this visit.     Allergies-reviewed and updated Allergies  Allergen Reactions  . Penicillins Swelling    REACTION: tongue swealling Has patient had a PCN reaction causing immediate rash, facial/tongue/throat swelling, SOB or lightheadedness with hypotension: yes Has patient had a PCN reaction causing severe rash involving mucus membranes or skin necrosis: no Has patient had a PCN reaction that required hospitalization: yes dr's office visit Has patient had a PCN reaction occurring within the last 10 years: no If all of the above answers are "NO", then may proceed with Cephalosporin use.   . Latex Itching and Rash    Redness and itching with  latex exposure    Social History   Socioeconomic History  . Marital status: Single    Spouse name: Not on file  . Number of children: 0  . Years of education: 7016  . Highest education level: Not on file  Occupational History  . Occupation: Lab  Social Needs  . Financial resource strain: Not on file  . Food insecurity:    Worry: Not on file    Inability: Not on file  . Transportation needs:    Medical: Not on file    Non-medical: Not on file  Tobacco Use  . Smoking status: Never Smoker  . Smokeless tobacco: Never Used  Substance and  Sexual Activity  . Alcohol use: No    Alcohol/week: 0.0 standard drinks  . Drug use: No  . Sexual activity: Yes    Birth control/protection: None    Comment: 1st intercourse 37 yo-5 partners  Lifestyle  . Physical activity:    Days per week: Not on file    Minutes per session: Not on file  . Stress: Not on file  Relationships  . Social connections:    Talks on phone: Not on file    Gets together: Not on file    Attends religious service: Not on file    Active member of club or organization: Not on file    Attends meetings of clubs or organizations: Not on file    Relationship status: Not on file  Other Topics Concern  . 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 Soft, Nontender, Nondistended. MSK: No edema, cyanosis, or clubbing noted Skin: Warm, dry Neuro: Grossly normal, moves all extremities Psych: Normal affect and thought content  Assessment/Plan:  Diarrhea Likely viral.  Benign abdominal exam.  Recommended conservative measures including Imodium as needed.  Also encouraged good oral hydration, brat diet with plenty of fiber, and probiotics.  She can use Zofran as needed for discussed reasons return to care.  Seasonal allergies Stable.  Continue Singulair as needed.  Photosensitivity dermatitis Stable.  Continue sunscreen.  Will be dropping off a form later this week to have medicated tint in her windows.   Preventative health care Obtain records from OB/GYN.  Katina Degreealeb M. Jimmey RalphParker, MD 05/19/2018 5:18 PM

## 2018-05-19 NOTE — Assessment & Plan Note (Signed)
Stable.  Continue Singulair as needed.

## 2018-05-22 DIAGNOSIS — Z1329 Encounter for screening for other suspected endocrine disorder: Secondary | ICD-10-CM | POA: Diagnosis not present

## 2018-05-22 DIAGNOSIS — Z130 Encounter for screening for diseases of the blood and blood-forming organs and certain disorders involving the immune mechanism: Secondary | ICD-10-CM | POA: Diagnosis not present

## 2018-05-22 DIAGNOSIS — Z0000 Encounter for general adult medical examination without abnormal findings: Secondary | ICD-10-CM | POA: Diagnosis not present

## 2018-05-22 DIAGNOSIS — Z13220 Encounter for screening for lipoid disorders: Secondary | ICD-10-CM | POA: Diagnosis not present

## 2018-05-22 DIAGNOSIS — Z131 Encounter for screening for diabetes mellitus: Secondary | ICD-10-CM | POA: Diagnosis not present

## 2018-05-27 ENCOUNTER — Ambulatory Visit: Payer: 59 | Admitting: Family Medicine

## 2018-06-08 ENCOUNTER — Ambulatory Visit: Payer: 59 | Admitting: Emergency Medicine

## 2018-06-15 ENCOUNTER — Encounter: Payer: Self-pay | Admitting: Family Medicine

## 2018-06-16 ENCOUNTER — Encounter: Payer: Self-pay | Admitting: Family Medicine

## 2018-06-16 ENCOUNTER — Ambulatory Visit: Payer: 59 | Admitting: Family Medicine

## 2018-06-16 VITALS — BP 110/70 | HR 84 | Temp 98.3°F | Ht 66.0 in | Wt 202.2 lb

## 2018-06-16 DIAGNOSIS — F419 Anxiety disorder, unspecified: Secondary | ICD-10-CM | POA: Diagnosis not present

## 2018-06-16 DIAGNOSIS — J329 Chronic sinusitis, unspecified: Secondary | ICD-10-CM

## 2018-06-16 MED ORDER — CITALOPRAM HYDROBROMIDE 20 MG PO TABS: 20.0000 mg | ORAL_TABLET | Freq: Every day | ORAL | 3 refills | Status: DC

## 2018-06-16 MED ORDER — IPRATROPIUM BROMIDE 0.06 % NA SOLN: 2.0000 | Freq: Four times a day (QID) | NASAL | 0 refills | Status: DC

## 2018-06-16 MED ORDER — HYDROXYZINE HCL 25 MG PO TABS: 25.0000 mg | ORAL_TABLET | Freq: Three times a day (TID) | ORAL | 0 refills | Status: DC | PRN

## 2018-06-16 MED ORDER — AZITHROMYCIN 250 MG PO TABS: ORAL_TABLET | ORAL | 0 refills | Status: DC

## 2018-06-16 MED FILL — CITALOPRAM HBR 20 MG TABLET: 20 | 30 days supply | Qty: 30 | Fill #0

## 2018-06-16 MED FILL — hydrOXYzine HCL 25 MG TABS: 25 | 10 days supply | Qty: 30 | Fill #0

## 2018-06-16 MED FILL — AZITHROMYCIN 250 MG TABLET: 250 | 5 days supply | Qty: 6 | Fill #0

## 2018-06-16 NOTE — Progress Notes (Signed)
   Subjective:  Teresa Harvey is a 38 y.o. female who presents today with a chief complaint of anxiety.   HPI:  Anxiety,, chronic problem, new to writer Patient states that she was previously diagnosed with PTSD and anxiety over 10 years ago.  She has seen psychiatry and therapist for this in the past.  She was on medications previously but does not remember the name.  She did well for several years however over the last couple of months has noticed significant worsening of her symptoms.  She has had several life stressors including a newborn child and starting school for medical coding.  She has had some depressive symptoms as well.  Depression screen PHQ 2/9 06/16/2018  Decreased Interest 0  Down, Depressed, Hopeless 1  PHQ - 2 Score 1  Altered sleeping 0  Tired, decreased energy 2  Change in appetite 0  Feeling bad or failure about yourself  0  Trouble concentrating 0  Moving slowly or fidgety/restless 0  Suicidal thoughts 0  PHQ-9 Score 3  Difficult doing work/chores Not difficult at all    GAD 7 : Generalized Anxiety Score 06/16/2018  Nervous, Anxious, on Edge 2  Control/stop worrying 2  Worry too much - different things 3  Trouble relaxing 1  Restless 1  Easily annoyed or irritable 3  Afraid - awful might happen 0  Total GAD 7 Score 12  Anxiety Difficulty Very difficult   Sinusitis Start about a week ago.  Associated with nasal congestion and rhinorrhea.  She has had some sputum production.  She tried taking Singulair with modest improvement.  No fever or chills.  Her daughter is been sick with similar symptoms.  No other obvious alleviating or aggravating factors.  ROS: Per HPI  PMH: She reports that she has never smoked. She has never used smokeless tobacco. She reports that she does not drink alcohol or use drugs.  Objective:  Physical Exam: BP 110/70 (BP Location: Left Arm, Patient Position: Sitting, Cuff Size: Normal)   Pulse 84   Temp 98.3 F (36.8 C) (Oral)    Ht 5\' 6"  (1.676 m)   Wt 202 lb 4 oz (91.7 kg)   LMP 05/19/2018   SpO2 97%   BMI 32.64 kg/m   Gen: NAD, resting comfortably HEENT: TMs clear.  OP erythematous.  Nose mucosa erythematous and boggy with yellow discharge. CV: RRR with no murmurs appreciated Pulm: NWOB, CTAB with no crackles, wheezes, or rhonchi Neuro: Grossly normal, moves all extremities Psych: Normal affect and thought content  Assessment/Plan:  Anxiety GAD significantly elevated.  Discussed treatment options.  She declined referral for psychotherapy today.  Handout was given.  Stated she would think about this.  We will start Celexa 20 mg daily.  Also start hydroxyzine 25 mg 3 times daily as needed.  She will follow-up with me in 4 to 6 weeks.  Given her reported history of PTSD, may need referral to psychiatry depending on response to above medications.  Discussed reasons to return to care earlier.  Sinusitis Likely secondary to viral URI. No signs of bacterial infection. Start atrovent for rhinorrhea/sinus congestion. Sent in a "pocket prescription" for azithromycin with strict instruction to not start unless symptoms worsen or fail to improve within the next several days. Recommended tylenol and/or motrin as needed for low grade fever and pain. Encouraged good oral hydration. Return precautions reviewed. Follow up as needed.    Katina Degree. [name], MD 06/16/2018 4:44 PM

## 2018-06-16 NOTE — Assessment & Plan Note (Signed)
GAD significantly elevated.  Discussed treatment options.  She declined referral for psychotherapy today.  Handout was given.  Stated she would think about this.  We will start Celexa 20 mg daily.  Also start hydroxyzine 25 mg 3 times daily as needed.  She will follow-up with me in 4 to 6 weeks.  Given her reported history of PTSD, may need referral to psychiatry depending on response to above medications.  Discussed reasons to return to care earlier.

## 2018-06-16 NOTE — Patient Instructions (Addendum)
Start the celexa. Use the hydroxyzine as needed for anxiety.  Start the atrovent.  Start the zpack if your symptoms worsen or do not improve in a few days.  Please stay well hydrated.  You can take tylenol and/or motrin as needed for low grade fever and pain.  Please let me know if your symptoms worsen or fail to improve.  Come back to see me in 4-6 weeks, or sooner as needed.   Take care, Dr [name]

## 2018-07-16 MED FILL — CITALOPRAM HBR 20 MG TABLET: 20 | 30 days supply | Qty: 30 | Fill #1

## 2018-07-20 ENCOUNTER — Encounter: Payer: Self-pay | Admitting: Family Medicine

## 2018-07-20 ENCOUNTER — Ambulatory Visit: Payer: 59 | Admitting: Family Medicine

## 2018-07-20 VITALS — BP 108/62 | HR 76 | Temp 98.5°F | Ht 66.0 in | Wt 198.2 lb

## 2018-07-20 DIAGNOSIS — J302 Other seasonal allergic rhinitis: Secondary | ICD-10-CM

## 2018-07-20 DIAGNOSIS — F419 Anxiety disorder, unspecified: Secondary | ICD-10-CM

## 2018-07-20 MED ORDER — CITALOPRAM HYDROBROMIDE 20 MG PO TABS: 20.0000 mg | ORAL_TABLET | Freq: Every day | ORAL | 3 refills | Status: DC

## 2018-07-20 NOTE — Assessment & Plan Note (Signed)
Doing much better.  Continue Celexa 20 mg daily.  Will refill for 1 year.  Follow-up 1 year, or sooner as needed.

## 2018-07-20 NOTE — Progress Notes (Signed)
   Chief Complaint:  Teresa Harvey is a 38 y.o. female who presents today with a chief complaint of anxiety follow-up.   Assessment/Plan:  Seasonal allergies Stable.  Continue Singulair and Atrovent as needed.  Anxiety Doing much better.  Continue Celexa 20 mg daily.  Will refill for 1 year.  Follow-up 1 year, or sooner as needed.    Subjective:  HPI:  Her stable, chronic medical conditions are outlined below:  # Anxiety - On celexa  20mg  daily and hydroxyzine 25mg  three times daily as needed.  - ROS: No SI or HI  # Allergic Rhinitis - Uses atrovent nasal spray as needed -Uses Singulair as needed seasonally.  No current symptoms.   Depression screen PHQ 2/9 07/20/2018  Decreased Interest 0  Down, Depressed, Hopeless 0  PHQ - 2 Score 0  Altered sleeping -  Tired, decreased energy -  Change in appetite -  Feeling bad or failure about yourself  -  Trouble concentrating -  Moving slowly or fidgety/restless -  Suicidal thoughts -  PHQ-9 Score -  Difficult doing work/chores Not difficult at all    GAD 7 : Generalized Anxiety Score 07/20/2018  Nervous, Anxious, on Edge 0  Control/stop worrying 0  Worry too much - different things 0  Trouble relaxing 0  Restless 0  Easily annoyed or irritable 0  Afraid - awful might happen 0  Total GAD 7 Score 0  Anxiety Difficulty Not difficult at all   ROS: Per HPI  PMH: She reports that she has never smoked. She has never used smokeless tobacco. She reports that she does not drink alcohol or use drugs.      Objective:  Physical Exam: BP 108/62 (BP Location: Left Arm, Patient Position: Sitting, Cuff Size: Normal)   Pulse 76   Temp 98.5 F (36.9 C) (Oral)   Ht 5\' 6"  (1.676 m)   Wt 198 lb 3.2 oz (89.9 kg)   LMP 07/20/2018   SpO2 97%   BMI 31.99 kg/m   Gen: NAD, resting comfortably Psych: Normal affect and thought content     Caleb M. [name], MD 07/20/2018 4:10 PM

## 2018-07-20 NOTE — Patient Instructions (Signed)
It was very nice to see you today!  I am glad that you are doing well.  We will not make any changes today.  Please come back to see me in 1 year for your physical with blood work, or sooner as needed.  Take care, Dr [name]

## 2018-07-20 NOTE — Assessment & Plan Note (Signed)
Stable.  Continue Singulair and Atrovent as needed.

## 2018-07-29 MED FILL — VALACYCLOVIR HCL 500 MG TAB: 500 | 30 days supply | Qty: 40 | Fill #1

## 2018-08-12 ENCOUNTER — Encounter: Payer: Self-pay | Admitting: Family Medicine

## 2018-08-12 ENCOUNTER — Ambulatory Visit: Payer: 59 | Admitting: Family Medicine

## 2018-08-12 VITALS — BP 104/70 | HR 85 | Temp 98.5°F | Ht 66.0 in | Wt 196.2 lb

## 2018-08-12 DIAGNOSIS — J329 Chronic sinusitis, unspecified: Secondary | ICD-10-CM

## 2018-08-12 DIAGNOSIS — J029 Acute pharyngitis, unspecified: Secondary | ICD-10-CM

## 2018-08-12 LAB — POCT RAPID STREP A (OFFICE): Rapid Strep A Screen: NEGATIVE

## 2018-08-12 MED ORDER — DOXYCYCLINE HYCLATE 100 MG PO TABS: 100.0000 mg | ORAL_TABLET | Freq: Two times a day (BID) | ORAL | 0 refills | Status: DC

## 2018-08-12 MED ORDER — IPRATROPIUM BROMIDE 0.06 % NA SOLN: 2.0000 | Freq: Four times a day (QID) | NASAL | 0 refills | Status: DC

## 2018-08-12 MED ORDER — METHYLPREDNISOLONE ACETATE 80 MG/ML IJ SUSP
80.0000 mg | Freq: Once | INTRAMUSCULAR | Status: AC
  Administered 2018-08-12: 80 mg via INTRAMUSCULAR

## 2018-08-12 MED FILL — DOXYCYCLINE HYCLATE 100 MG: 100 | 7 days supply | Qty: 14 | Fill #0

## 2018-08-12 MED FILL — IPRATROPIUM 0.06% SPRAY: 0.06 | 30 days supply | Qty: 15 | Fill #0

## 2018-08-12 NOTE — Progress Notes (Signed)
   Chief Complaint:  Teresa Harvey is a 38 y.o. female who presents for same day appointment with a chief complaint of cough.   Assessment/Plan:  Sinusitis / Cough Likely secondary to viral URI. No signs of bacterial infection. Start atrovent for rhinorrhea/sinus congestion. Will give 80mg  IM depo-medrol for sore throat.Sent in a "pocket prescription" for doxycycline with strict instruction to not start unless symptoms worsen or fail to improve within the next several days. Recommended tylenol and/or motrin as needed for low grade fever and pain. Encouraged good oral hydration. Return precautions reviewed. Follow up as needed.      Subjective:  HPI:  Cough, acute problem Started 4 days ago. Worsened over that time. Associated with ear pain, shortness of breath, sinus congestion. Tried taking nyquil and delsym which did not work. Symptoms seem to be worse. Daughter has been sick with similar symptoms. No other obvious alleviating or aggravating factors.   ROS: Per HPI  PMH: She reports that she has never smoked. She has never used smokeless tobacco. She reports that she does not drink alcohol or use drugs.      Objective:  Physical Exam: BP 104/70 (BP Location: Left Arm, Patient Position: Sitting, Cuff Size: Normal)   Pulse 85   Temp 98.5 F (36.9 C) (Oral)   Ht 5\' 6"  (1.676 m)   Wt 196 lb 4 oz (89 kg)   LMP 07/20/2018   SpO2 96%   BMI 31.68 kg/m   Gen: NAD, resting comfortably HEENT: TMs with clear effusion.  OP erythematous with no exudate. Nasal mucosa erythematous and boggy bilaterally with watery discharge. CV: Regular rate and rhythm with no murmurs appreciated Pulm: Normal work of breathing, clear to auscultation bilaterally with no crackles, wheezes, or rhonchi     Nikko Quast M. [name], MD 08/12/2018 12:04 PM

## 2018-08-12 NOTE — Patient Instructions (Addendum)
Start the atrovent.  Start doxycycline if your symptoms do not improve in a few days or if they worsen.   We will give you a cortisone shot today.   Please stay well hydrated.  You can take tylenol and/or motrin as needed for low grade fever and pain.  Please let me know if your symptoms worsen or fail to improve.  Take care, Dr [name]

## 2018-08-13 MED FILL — CITALOPRAM HBR 20 MG TABLET: 20 | 30 days supply | Qty: 30 | Fill #2

## 2018-09-16 MED FILL — CITALOPRAM HBR 20 MG TABLET: 20 | 30 days supply | Qty: 30 | Fill #3

## 2018-09-16 MED FILL — MONTELUKAST SOD 10 MG TAB: 10 | 90 days supply | Qty: 90 | Fill #1

## 2018-10-22 MED FILL — CITALOPRAM HBR 20 MG TABLET: 20 | 90 days supply | Qty: 90 | Fill #0

## 2018-11-10 ENCOUNTER — Encounter: Payer: Self-pay | Admitting: Family Medicine

## 2018-11-10 ENCOUNTER — Ambulatory Visit (INDEPENDENT_AMBULATORY_CARE_PROVIDER_SITE_OTHER): Payer: 59 | Admitting: Family Medicine

## 2018-11-10 ENCOUNTER — Other Ambulatory Visit: Payer: Self-pay

## 2018-11-10 VITALS — BP 114/64 | HR 82 | Temp 98.0°F | Ht 66.0 in | Wt 199.4 lb

## 2018-11-10 DIAGNOSIS — Z0001 Encounter for general adult medical examination with abnormal findings: Secondary | ICD-10-CM

## 2018-11-10 DIAGNOSIS — Z6832 Body mass index (BMI) 32.0-32.9, adult: Secondary | ICD-10-CM

## 2018-11-10 DIAGNOSIS — R739 Hyperglycemia, unspecified: Secondary | ICD-10-CM | POA: Diagnosis not present

## 2018-11-10 DIAGNOSIS — Z13220 Encounter for screening for lipoid disorders: Secondary | ICD-10-CM

## 2018-11-10 DIAGNOSIS — J302 Other seasonal allergic rhinitis: Secondary | ICD-10-CM | POA: Diagnosis not present

## 2018-11-10 MED ORDER — CITALOPRAM HYDROBROMIDE 40 MG PO TABS: 40.0000 mg | ORAL_TABLET | Freq: Every day | ORAL | 3 refills | Status: DC

## 2018-11-10 MED ORDER — MONTELUKAST SODIUM 10 MG PO TABS: 10.0000 mg | ORAL_TABLET | Freq: Every day | ORAL | 3 refills | Status: DC

## 2018-11-10 MED FILL — CITALOPRAM HBR 40 MG TABLET: 40 | 90 days supply | Qty: 90 | Fill #0

## 2018-11-10 NOTE — Progress Notes (Signed)
Chief Complaint:  Teresa Harvey is a 38 y.o. female who presents today for her annual comprehensive physical exam.    Assessment/Plan:  Seasonal allergies Stable.  Refilled Singulair.  If no improvement consider trial of Astelin.  Hyperglycemia Check A1c.  Preventative Healthcare: Check lipid panel.  Up-to-date on other vaccines and screenings.  Will obtain records from OB/GYN.  Patient Counseling(The following topics were reviewed and/or handout was given):  -Nutrition: Stressed importance of moderation in sodium/caffeine intake, saturated fat and cholesterol, caloric balance, sufficient intake of fresh fruits, vegetables, and fiber.  -Stressed the importance of regular exercise.   -Substance Abuse: Discussed cessation/primary prevention of tobacco, alcohol, or other drug use; driving or other dangerous activities under the influence; availability of treatment for abuse.   -Injury prevention: Discussed safety belts, safety helmets, smoke detector, smoking near bedding or upholstery.   -Sexuality: Discussed sexually transmitted diseases, partner selection, use of condoms, avoidance of unintended pregnancy and contraceptive alternatives.   -Dental health: Discussed importance of regular tooth brushing, flossing, and dental visits.  -Health maintenance and immunizations reviewed. Please refer to Health maintenance section.  Return to care in 1 year for next preventative visit.     Subjective:  HPI:  She has no acute complaints today.   Recently had a Pap at OB/GYN.  Reportedly normal.  She has had some worsening allergy issues.  Interested in trying Singulair again.  She has been on this in the past.  [facility]  Her stable, chronic medical conditions are outlined below:  # Anxiety - On celexa 40mg  daily tolerating well without side effects. - ROS: No SI or HI  # Allergic Rhinitis -Uses Singulair and atrovent nasal spray as needed seasonally.  No current  symptoms.  # History of HSV Infection - Uses Valtrex as needed for outbreaks    Lifestyle Diet: No specific diets or eating plans. Exercise: No specific exercises.   Depression screen PHQ 2/9 07/20/2018  Decreased Interest 0  Down, Depressed, Hopeless 0  PHQ - 2 Score 0  Altered sleeping -  Tired, decreased energy -  Change in appetite -  Feeling bad or failure about yourself  -  Trouble concentrating -  Moving slowly or fidgety/restless -  Suicidal thoughts -  PHQ-9 Score -  Difficult doing work/chores Not difficult at all   Health Maintenance Due  Topic Date Due  . PAP SMEAR-Modifier  01/27/2016    ROS: Per HPI, otherwise a complete review of systems was negative.   PMH:  The following were reviewed and entered/updated in epic: Past Medical History:  Diagnosis Date  . Cervical dysplasia 2004   no treatment, normal paps after  . Eczema   . HSV infection   . Hx of gonorrhea   . Medical history non-contributory   . Pregnancy induced hypertension   . Vaginal Pap smear, abnormal    Patient Active Problem List   Diagnosis Date Noted  . Anxiety 06/16/2018  . 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 Seasonal allergies 09/09/2014  . HSV infection    Past Surgical History:  Procedure Laterality Date  . CESAREAN SECTION N/A 03/21/2017   Procedure: Primary CESAREAN SECTION;  Surgeon: Shea EvansMody, Vaishali, MD;  [facility];  Service: Obstetrics;  Laterality: N/A;  EDD: 03/28/17 Allergy: Penicillin   . COLPOSCOPY    . ESOPHAGOGASTRODUODENOSCOPY      Family History  Problem Relation Age of Onset  . Heart disease Father   . Heart attack Father   .  Eczema Father   . Hypertension Maternal Grandmother   . Cerebral palsy Maternal Grandmother   . Bipolar disorder Mother   . Asthma Mother   . Allergic rhinitis Neg Hx   . Angioedema Neg Hx   . Urticaria Neg Hx    Medications- reviewed and updated Current Outpatient Medications  Medication Sig  Dispense Refill  . citalopram (CELEXA) 40 MG tablet Take 1 tablet (40 mg total) by mouth daily. 90 tablet 3  . ibuprofen (ADVIL,MOTRIN) 600 MG tablet Take 1 tablet (600 mg total) by mouth every 6 (six) hours. 30 tablet 0  . ipratropium (ATROVENT) 0.06 % nasal spray Place 2 sprays into both nostrils 4 (four) times daily. 15 mL 0  . valACYclovir (VALTREX) 500 MG tablet Take 500 mg by mouth daily.  3  . montelukast (SINGULAIR) 10 MG tablet Take 1 tablet (10 mg total) by mouth at bedtime. 90 tablet 3   No current facility-administered medications for this visit.     Allergies-reviewed and updated Allergies  Allergen Reactions  . Penicillins Swelling    REACTION: tongue swealling Has patient had a PCN reaction causing immediate rash, facial/tongue/throat swelling, SOB or lightheadedness with hypotension: yes Has patient had a PCN reaction causing severe rash involving mucus membranes or skin necrosis: no Has patient had a PCN reaction that required hospitalization: yes dr's office visit Has patient had a PCN reaction occurring within the last 10 years: no If all of the above answers are "NO", then may proceed with Cephalosporin use.   . Latex Itching and Rash    Redness and itching with latex exposure    Social History   Socioeconomic History  . Marital status: Single    Spouse name: Not on file  . Number of children: 0  . Years of education: 47  . Highest education level: Not on file  Occupational History  . Occupation: Lab  Social Needs  . Financial resource strain: Not on file  . Food insecurity:    Worry: Not on file    Inability: Not on file  . Transportation needs:    Medical: Not on file    Non-medical: Not on file  Tobacco Use  . Smoking status: Never Smoker  . Smokeless tobacco: Never Used  Substance and Sexual Activity  . Alcohol use: No    Alcohol/week: 0.0 standard drinks  . Drug use: No  . Sexual activity: Yes    Birth control/protection: None    Comment:  1st intercourse 17 yo-5 partners  Lifestyle  . Physical activity:    Days per week: Not on file    Minutes per session: Not on file  . Stress: Not on file  Relationships  . Social connections:    Talks on phone: Not on file    Gets together: Not on file    Attends religious service: Not on file    Active member of club or organization: Not on file    Attends meetings of clubs or organizations: Not on file    Relationship status: Not on file  Other Topics Concern  . 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 Wt Readings from Last 3 Encounters:  11/10/18 199 lb 6.4 oz (90.4 kg)  08/12/18 196 lb 4 oz (89 kg)  07/20/18 198 lb 3.2 oz (89.9 kg)   Gen: NAD, resting comfortably HEENT: TMs normal bilaterally. OP clear. No thyromegaly noted.  CV: RRR with no murmurs appreciated Pulm: NWOB, CTAB with no crackles, wheezes, or rhonchi GI: Normal bowel sounds present. Soft, Nontender, Nondistended. MSK: no edema, cyanosis, or clubbing noted Skin: warm, dry Neuro: CN2-12 grossly intact. Strength 5/5 in upper and lower extremities. Reflexes symmetric and intact bilaterally.  Psych: Normal affect and thought content     Tehani Mersman M. [name], MD 11/10/2018 4:43 PM

## 2018-11-10 NOTE — Patient Instructions (Addendum)
It was very nice to see you today!  We will check blood work today.  Pleas try the singulair.  I will send in a higher strength celexa.   Eat at least 3 REAL meals and 1-2 snacks per day.  Aim for no more than 5 hours between eating.  Eat breakfast within one hour of getting up.    Obtain twice as many fruits/vegetables as protein or carbohydrate foods for both lunch and dinner.   Cut down on sweet beverages. This includes juice, soda, and sweet tea.    Exercise at least 150 minutes every week.   Come back in 1 year for your next physical, or sooner as needed.   Take care, Dr Jerline Pain   Preventive Care 18-39 Years, Female Preventive care refers to lifestyle choices and visits with your health care provider that can promote health and wellness. What does preventive care include?   A yearly physical exam. This is also called an annual well check.  Dental exams once or twice a year.  Routine eye exams. Ask your health care provider how often you should have your eyes checked.  Personal lifestyle choices, including: ? Daily care of your teeth and gums. ? Regular physical activity. ? Eating a healthy diet. ? Avoiding tobacco and drug use. ? Limiting alcohol use. ? Practicing safe sex. ? Taking vitamin and mineral supplements as recommended by your health care provider. What happens during an annual well check? The services and screenings done by your health care provider during your annual well check will depend on your age, overall health, lifestyle risk factors, and family history of disease. Counseling Your health care provider may ask you questions about your:  Alcohol use.  Tobacco use.  Drug use.  Emotional well-being.  Home and relationship well-being.  Sexual activity.  Eating habits.  Work and work Statistician.  Method of birth control.  Menstrual cycle.  Pregnancy history. Screening You may have the following tests or measurements:  Height,  weight, and BMI.  Diabetes screening. This is done by checking your blood sugar (glucose) after you have not eaten for a while (fasting).  Blood pressure.  Lipid and cholesterol levels. These may be checked every 5 years starting at age 91.  Skin check.  Hepatitis C blood test.  Hepatitis B blood test.  Sexually transmitted disease (STD) testing.  BRCA-related cancer screening. This may be done if you have a family history of breast, ovarian, tubal, or peritoneal cancers.  Pelvic exam and Pap test. This may be done every 3 years starting at age 68. Starting at age 62, this may be done every 5 years if you have a Pap test in combination with an HPV test. Discuss your test results, treatment options, and if necessary, the need for more tests with your health care provider. Vaccines Your health care provider may recommend certain vaccines, such as:  Influenza vaccine. This is recommended every year.  Tetanus, diphtheria, and acellular pertussis (Tdap, Td) vaccine. You may need a Td booster every 10 years.  Varicella vaccine. You may need this if you have not been vaccinated.  HPV vaccine. If you are 45 or younger, you may need three doses over 6 months.  Measles, mumps, and rubella (MMR) vaccine. You may need at least one dose of MMR. You may also need a second dose.  Pneumococcal 13-valent conjugate (PCV13) vaccine. You may need this if you have certain conditions and were not previously vaccinated.  Pneumococcal polysaccharide (PPSV23) vaccine. You  may need one or two doses if you smoke cigarettes or if you have certain conditions.  Meningococcal vaccine. One dose is recommended if you are age 54-21 years and a first-year college student living in a residence hall, or if you have one of several medical conditions. You may also need additional booster doses.  Hepatitis A vaccine. You may need this if you have certain conditions or if you travel or work in places where you may be  exposed to hepatitis A.  Hepatitis B vaccine. You may need this if you have certain conditions or if you travel or work in places where you may be exposed to hepatitis B.  Haemophilus influenzae type b (Hib) vaccine. You may need this if you have certain risk factors. Talk to your health care provider about which screenings and vaccines you need and how often you need them. This information is not intended to replace advice given to you by your health care provider. Make sure you discuss any questions you have with your health care provider. Document Released: 07/23/2001 Document Revised: 01/07/2017 Document Reviewed: 03/28/2015 Elsevier Interactive Patient Education  2019 [employer].

## 2018-11-10 NOTE — Assessment & Plan Note (Addendum)
Stable.  Refilled Singulair.  If no improvement consider trial of Astelin.  Check CBC, C met, and TSH.

## 2018-11-11 ENCOUNTER — Encounter: Payer: Self-pay | Admitting: Family Medicine

## 2018-11-11 DIAGNOSIS — E785 Hyperlipidemia, unspecified: Secondary | ICD-10-CM | POA: Insufficient documentation

## 2018-11-11 LAB — COMPREHENSIVE METABOLIC PANEL
ALT: 8 U/L (ref 0–35)
AST: 11 U/L (ref 0–37)
Albumin: 4 g/dL (ref 3.5–5.2)
Alkaline Phosphatase: 56 U/L (ref 39–117)
BUN: 18 mg/dL (ref 6–23)
CO2: 29 mEq/L (ref 19–32)
Calcium: 9.1 mg/dL (ref 8.4–10.5)
Chloride: 100 mEq/L (ref 96–112)
Creatinine, Ser: 0.8 mg/dL (ref 0.40–1.20)
GFR: 80.08 mL/min (ref >60.00)
Glucose, Bld: 85 mg/dL (ref 70–99)
Potassium: 3.9 mEq/L (ref 3.5–5.1)
Sodium: 137 mEq/L (ref 135–145)
Total Bilirubin: 0.3 mg/dL (ref 0.2–1.2)
Total Protein: 7.4 g/dL (ref 6.0–8.3)

## 2018-11-11 LAB — CBC
HCT: 40 % (ref 36.0–46.0)
Hemoglobin: 13.5 g/dL (ref 12.0–15.0)
MCHC: 33.8 g/dL (ref 30.0–36.0)
MCV: 89.5 fl (ref 78.0–100.0)
Platelets: 262 10*3/uL (ref 150.0–400.0)
RBC: 4.47 Mil/uL (ref 3.87–5.11)
RDW: 13.4 % (ref 11.5–15.5)
WBC: 7.9 10*3/uL (ref 4.0–10.5)

## 2018-11-11 LAB — LIPID PANEL
Cholesterol: 177 mg/dL (ref 0–200)
HDL: 63.1 mg/dL (ref >39.00)
LDL Cholesterol: 106 mg/dL — ABNORMAL HIGH (ref 0–99)
NonHDL: 113.71
Total CHOL/HDL Ratio: 3
Triglycerides: 39 mg/dL (ref 0.0–149.0)
VLDL: 7.8 mg/dL (ref 0.0–40.0)

## 2018-11-11 LAB — TSH: TSH: 0.91 u[IU]/mL (ref 0.35–4.50)

## 2018-11-11 LAB — HEMOGLOBIN A1C: Hgb A1c MFr Bld: 5.5 % (ref 4.6–6.5)

## 2018-11-11 NOTE — Progress Notes (Signed)
Please inform patient of the following:  Her "bad" cholesterol was a bit high but everything else is NORMAL. Do not need to start meds. Recommend working on diet and exercise and we can recheck in a year.  Teresa Harvey. [name], MD 11/11/2018 12:03 PM

## 2019-01-13 ENCOUNTER — Other Ambulatory Visit: Payer: Self-pay

## 2019-01-13 ENCOUNTER — Emergency Department (HOSPITAL_COMMUNITY): Payer: 59

## 2019-01-13 ENCOUNTER — Emergency Department (HOSPITAL_COMMUNITY)
Admission: EM | Admit: 2019-01-13 | Discharge: 2019-01-14 | Disposition: A | Discharge status: Home / Self Care | Payer: 59 | Attending: Emergency Medicine | Admitting: Emergency Medicine

## 2019-01-13 ENCOUNTER — Encounter (HOSPITAL_COMMUNITY): Payer: Self-pay | Admitting: Emergency Medicine

## 2019-01-13 DIAGNOSIS — R1084 Generalized abdominal pain: Secondary | ICD-10-CM

## 2019-01-13 DIAGNOSIS — S199XXA Unspecified injury of neck, initial encounter: Secondary | ICD-10-CM | POA: Diagnosis present

## 2019-01-13 DIAGNOSIS — Z79899 Other long term (current) drug therapy: Secondary | ICD-10-CM | POA: Insufficient documentation

## 2019-01-13 DIAGNOSIS — S134XXA Sprain of ligaments of cervical spine, initial encounter: Secondary | ICD-10-CM | POA: Diagnosis not present

## 2019-01-13 DIAGNOSIS — Y9389 Activity, other specified: Secondary | ICD-10-CM | POA: Diagnosis not present

## 2019-01-13 DIAGNOSIS — R109 Unspecified abdominal pain: Secondary | ICD-10-CM | POA: Diagnosis not present

## 2019-01-13 DIAGNOSIS — M542 Cervicalgia: Secondary | ICD-10-CM | POA: Diagnosis not present

## 2019-01-13 DIAGNOSIS — R079 Chest pain, unspecified: Secondary | ICD-10-CM | POA: Diagnosis not present

## 2019-01-13 DIAGNOSIS — Y92410 Unspecified street and highway as the place of occurrence of the external cause: Secondary | ICD-10-CM | POA: Insufficient documentation

## 2019-01-13 DIAGNOSIS — Y999 Unspecified external cause status: Secondary | ICD-10-CM | POA: Insufficient documentation

## 2019-01-13 DIAGNOSIS — S139XXA Sprain of joints and ligaments of unspecified parts of neck, initial encounter: Secondary | ICD-10-CM

## 2019-01-13 DIAGNOSIS — S161XXA Strain of muscle, fascia and tendon at neck level, initial encounter: Secondary | ICD-10-CM | POA: Insufficient documentation

## 2019-01-13 DIAGNOSIS — S3991XA Unspecified injury of abdomen, initial encounter: Secondary | ICD-10-CM | POA: Diagnosis not present

## 2019-01-13 LAB — CBC WITH DIFFERENTIAL/PLATELET
Abs Immature Granulocytes: 0.01 10*3/uL (ref 0.00–0.07)
Basophils Absolute: 0.1 10*3/uL (ref 0.0–0.1)
Basophils Relative: 1 %
Eosinophils Absolute: 0 10*3/uL (ref 0.0–0.5)
Eosinophils Relative: 0 %
HCT: 42 % (ref 36.0–46.0)
Hemoglobin: 13.3 g/dL (ref 12.0–15.0)
Immature Granulocytes: 0 %
Lymphocytes Relative: 24 %
Lymphs Abs: 2.3 10*3/uL (ref 0.7–4.0)
MCH: 29.3 pg (ref 26.0–34.0)
MCHC: 31.7 g/dL (ref 30.0–36.0)
MCV: 92.5 fL (ref 80.0–100.0)
Monocytes Absolute: 0.8 10*3/uL (ref 0.1–1.0)
Monocytes Relative: 8 %
Neutro Abs: 6.3 10*3/uL (ref 1.7–7.7)
Neutrophils Relative %: 67 %
Platelets: 263 10*3/uL (ref 150–400)
RBC: 4.54 MIL/uL (ref 3.87–5.11)
RDW: 12.2 % (ref 11.5–15.5)
WBC: 9.4 10*3/uL (ref 4.0–10.5)
nRBC: 0 % (ref 0.0–0.2)

## 2019-01-13 LAB — BASIC METABOLIC PANEL
Anion gap: 9 (ref 5–15)
BUN: 13 mg/dL (ref 6–20)
CO2: 26 mmol/L (ref 22–32)
Calcium: 9.2 mg/dL (ref 8.9–10.3)
Chloride: 104 mmol/L (ref 98–111)
Creatinine, Ser: 0.77 mg/dL (ref 0.44–1.00)
GFR calc Af Amer: >60 mL/min (ref >60)
GFR calc non Af Amer: >60 mL/min (ref >60)
Glucose, Bld: 94 mg/dL (ref 70–99)
Potassium: 3.6 mmol/L (ref 3.5–5.1)
Sodium: 139 mmol/L (ref 135–145)

## 2019-01-13 LAB — PREGNANCY, URINE: Preg Test, Ur: NEGATIVE
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
## 2019-01-13 NOTE — ED Notes (Signed)
XR at bedside
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
## 2019-01-14 DIAGNOSIS — R1084 Generalized abdominal pain: Secondary | ICD-10-CM | POA: Diagnosis not present

## 2019-01-14 DIAGNOSIS — Z79899 Other long term (current) drug therapy: Secondary | ICD-10-CM | POA: Diagnosis not present

## 2019-01-14 DIAGNOSIS — M542 Cervicalgia: Secondary | ICD-10-CM | POA: Diagnosis not present

## 2019-01-14 DIAGNOSIS — S161XXA Strain of muscle, fascia and tendon at neck level, initial encounter: Secondary | ICD-10-CM | POA: Diagnosis not present

## 2019-01-14 DIAGNOSIS — R109 Unspecified abdominal pain: Secondary | ICD-10-CM | POA: Diagnosis not present

## 2019-01-14 DIAGNOSIS — S3991XA Unspecified injury of abdomen, initial encounter: Secondary | ICD-10-CM | POA: Diagnosis not present

## 2019-01-14 MED ORDER — METHOCARBAMOL 500 MG PO TABS: 500.0000 mg | ORAL_TABLET | Freq: Two times a day (BID) | ORAL | 0 refills | Status: DC

## 2019-01-14 MED ORDER — HYDROCODONE-ACETAMINOPHEN 5-325 MG PO TABS
1.0000 | ORAL_TABLET | Freq: Once | ORAL | Status: AC
  Administered 2019-01-14: 01:00:00 1 via ORAL
  Filled 2019-01-14: qty 1

## 2019-01-14 MED ORDER — SODIUM CHLORIDE (PF) 0.9 % IJ SOLN
INTRAMUSCULAR | Status: AC
  Filled 2019-01-14: qty 50

## 2019-01-14 MED ORDER — HYDROCODONE-ACETAMINOPHEN 5-325 MG PO TABS: 1.0000 | ORAL_TABLET | Freq: Four times a day (QID) | ORAL | 0 refills | Status: DC | PRN

## 2019-01-14 MED ORDER — IOHEXOL 300 MG/ML  SOLN
100.0000 mL | Freq: Once | INTRAMUSCULAR | Status: AC | PRN
  Administered 2019-01-14: 100 mL via INTRAVENOUS

## 2019-01-14 NOTE — Discharge Instructions (Signed)
Thank you for allowing me to care for you today in the Emergency Department.   It is normal to be sore after a care accident, particularly days 2-4.   Take 650 mg of Tylenol or 600 mg of ibuprofen with food every 6 hours for pain.  You can alternate between these 2 medications every 3 hours if your pain returns.  For instance, you can take Tylenol at noon, followed by a dose of ibuprofen at 3, followed by second dose of Tylenol and 6.  You can take 1 tablet of Robaxin by mouth 2 times daily for muscle pain and spasms.  For severe, uncontrollable pain, you can take 1 tablet of Vicodin every 6 hours as needed.  This medication is a narcotic.  It can be addicting.  You should not take it with other sedating substances, such as Robaxin or alcohol.  Do not work or drive while taking this medication because it causes you to be impaired.  Apply an ice pack to areas that are sore for 15 to 20 minutes as frequently as needed.  Start to stretch your muscles as your pain allows to avoid stiffness.  If your symptoms do not seem to improve in the next week after following this regimen, follow-up with primary care for reevaluation.  Return to the ER if you develop new, concerning symptoms such as new dizziness, persistent vomiting, blood in your urine, respiratory distress, if you pass out, or other new, concerning symptoms.

## 2019-01-15 ENCOUNTER — Encounter: Payer: Self-pay | Admitting: Family Medicine

## 2019-01-23 ENCOUNTER — Encounter: Payer: Self-pay | Admitting: Family Medicine

## 2019-01-26 ENCOUNTER — Ambulatory Visit: Payer: 59 | Admitting: Family Medicine

## 2019-02-12 ENCOUNTER — Other Ambulatory Visit: Payer: Self-pay | Admitting: Allergy

## 2019-03-02 ENCOUNTER — Encounter: Payer: Self-pay | Admitting: Gynecology

## 2019-03-23 MED FILL — CITALOPRAM HBR 40 MG TABLET: 40 | 90 days supply | Qty: 90 | Fill #1

## 2019-03-29 MED FILL — VALACYCLOVIR HCL 500 MG TAB: 500 | 30 days supply | Qty: 40 | Fill #2

## 2019-04-16 ENCOUNTER — Encounter: Payer: Self-pay | Admitting: Family Medicine

## 2019-04-16 ENCOUNTER — Telehealth: Payer: 59 | Admitting: Nurse Practitioner

## 2019-04-16 DIAGNOSIS — R059 Cough, unspecified: Secondary | ICD-10-CM

## 2019-04-16 DIAGNOSIS — R05 Cough: Secondary | ICD-10-CM | POA: Diagnosis not present

## 2019-04-16 MED ORDER — PREDNISONE 5 MG (48) PO TBPK: ORAL_TABLET | ORAL | 0 refills | Status: DC

## 2019-04-16 MED ORDER — BENZONATATE 100 MG PO CAPS: 100.0000 mg | ORAL_CAPSULE | Freq: Three times a day (TID) | ORAL | 0 refills | Status: DC | PRN

## 2019-04-16 MED FILL — predniSONE 5 MG (21) TBPK: 5 | 21 days supply | Qty: 21 | Fill #0

## 2019-04-16 MED FILL — BENZONATATE 100 MG CAPS: 100 | 6 days supply | Qty: 20 | Fill #0

## 2019-04-16 NOTE — Progress Notes (Signed)
We are sorry that you are not feeling well.  Here is how we plan to help!  Based on your presentation I believe you most likely have A cough due to a virus.  This is called viral bronchitis and is best treated by rest, plenty of fluids and control of the cough.  You may use Ibuprofen or Tylenol as directed to help your symptoms.     In addition you may use A prescription cough medication called Tessalon Perles 100mg . You may take 1-2 capsules every 8 hours as needed for your cough.  Prednisone 5 mg daily for 6 days (see taper instructions below)  Directions for 6 day taper: Day 1: 2 tablets before breakfast, 1 after both lunch & dinner and 2 at bedtime Day 2: 1 tab before breakfast, 1 after both lunch & dinner and 2 at bedtime Day 3: 1 tab at each meal & 1 at bedtime Day 4: 1 tab at breakfast, 1 at lunch, 1 at bedtime Day 5: 1 tab at breakfast & 1 tab at bedtime Day 6: 1 tab at breakfast   From your responses in the eVisit questionnaire you describe inflammation in the upper respiratory tract which is causing a significant cough.  This is commonly called Bronchitis and has four common causes:    Allergies  Viral Infections  Acid Reflux  Bacterial Infection Allergies, viruses and acid reflux are treated by controlling symptoms or eliminating the cause. An example might be a cough caused by taking certain blood pressure medications. You stop the cough by changing the medication. Another example might be a cough caused by acid reflux. Controlling the reflux helps control the cough.  USE OF BRONCHODILATOR ("RESCUE") INHALERS: There is a risk from using your bronchodilator too frequently.  The risk is that over-reliance on a medication which only relaxes the muscles surrounding the breathing tubes can reduce the effectiveness of medications prescribed to reduce swelling and congestion of the tubes themselves.  Although you feel brief relief from the bronchodilator inhaler, your asthma may  actually be worsening with the tubes becoming more swollen and filled with mucus.  This can delay other crucial treatments, such as oral steroid medications. If you need to use a bronchodilator inhaler daily, several times per day, you should discuss this with your provider.  There are probably better treatments that could be used to keep your asthma under control.     HOME CARE . Only take medications as instructed by your medical team. . Complete the entire course of an antibiotic. . Drink plenty of fluids and get plenty of rest. . Avoid close contacts especially the very young and the elderly . Cover your mouth if you cough or cough into your sleeve. . Always remember to wash your hands . A steam or ultrasonic humidifier can help congestion.   GET HELP RIGHT AWAY IF: . You develop worsening fever. . You become short of breath . You cough up blood. . Your symptoms persist after you have completed your treatment plan MAKE SURE YOU   Understand these instructions.  Will watch your condition.  Will get help right away if you are not doing well or get worse.  Your e-visit answers were reviewed by a board certified advanced clinical practitioner to complete your personal care plan.  Depending on the condition, your plan could have included both over the counter or prescription medications. If there is a problem please reply  once you have received a response from your provider. Your  safety is important to us.  If you have drug allergies check your prescription carefully.    You can use MyChart to ask questions about today's visit, request a non-urgent call back, or ask for a work or school excuse for 24 hours related to this e-Visit. If it has been greater than 24 hours you will need to follow up with your provider, or enter a new e-Visit to address those concerns. You will get an e-mail in the next two days asking about your experience.  I hope that your e-visit has been valuable and will  speed your recovery. Thank you for using e-visits.  5-10 minutes spent reviewing and documenting in chart.  

## 2019-05-18 DIAGNOSIS — Z1151 Encounter for screening for human papillomavirus (HPV): Secondary | ICD-10-CM | POA: Diagnosis not present

## 2019-05-18 DIAGNOSIS — Z6834 Body mass index (BMI) 34.0-34.9, adult: Secondary | ICD-10-CM | POA: Diagnosis not present

## 2019-05-18 DIAGNOSIS — Z01419 Encounter for gynecological examination (general) (routine) without abnormal findings: Secondary | ICD-10-CM | POA: Diagnosis not present

## 2019-05-18 MED FILL — VALACYCLOVIR HCL 500 MG TAB: 500 | 30 days supply | Qty: 40 | Fill #0

## 2019-06-08 ENCOUNTER — Encounter: Payer: Self-pay | Admitting: Family Medicine

## 2019-06-08 NOTE — Telephone Encounter (Signed)
Call and LVM to schedule appt with Dr. Jerline Pain

## 2019-06-15 ENCOUNTER — Other Ambulatory Visit: Payer: Self-pay

## 2019-06-16 ENCOUNTER — Ambulatory Visit (INDEPENDENT_AMBULATORY_CARE_PROVIDER_SITE_OTHER): Payer: 59 | Admitting: Family Medicine

## 2019-06-16 DIAGNOSIS — F419 Anxiety disorder, unspecified: Secondary | ICD-10-CM

## 2019-06-16 MED ORDER — BUPROPION HCL ER (XL) 150 MG PO TB24: 150.0000 mg | ORAL_TABLET | Freq: Every day | ORAL | 3 refills | Status: DC

## 2019-06-16 MED FILL — BUPROPION HCL XL 150 MG TAB: 150 | 90 days supply | Qty: 90 | Fill #0

## 2019-06-16 NOTE — Progress Notes (Signed)
   Teresa Harvey is a 39 y.o. female who presents today for a virtual office visit.  Assessment/Plan:  Chronic Problems Addressed Today: Anxiety Stable but is having some side effect of Celexa.  Given her good response in the past we will continue current dose and add on Wellbutrin 150 mg daily.  She will follow-up me in a couple of weeks.  If continues to have more fatigue and forgetfulness, would consider switching to lower dose of Celexa or alternative SSRI.     Subjective:  HPI:  She has noticed increasing forgetfulness over the last several weeks. She is concerned that it may be due to a side effect of celexa. She has low motivation. Does not feel any anxiety. She       Objective/Observations  Physical Exam: Gen: NAD, resting comfortably Pulm: Normal work of breathing Neuro: Grossly normal, moves all extremities Psych: Normal affect and thought content  Virtual Visit via Video   I connected with Teresa Harvey on 06/16/19 at 10:00 AM EST by a video enabled telemedicine application and verified that I am speaking with the correct person using two identifiers. The limitations of evaluation and management by telemedicine and the availability of in person appointments were discussed. The patient expressed understanding and agreed to proceed.   Patient location: Home Provider location: [place] [facility] [employer] Persons participating in the virtual visit: Myself and Patient     Katina Degree. [name], MD 06/16/2019 10:41 AM

## 2019-06-16 NOTE — Assessment & Plan Note (Signed)
Stable but is having some side effect of Celexa.  Given her good response in the past we will continue current dose and add on Wellbutrin 150 mg daily.  She will follow-up me in a couple of weeks.  If continues to have more fatigue and forgetfulness, would consider switching to lower dose of Celexa or alternative SSRI.

## 2019-08-16 MED FILL — VALACYCLOVIR HCL 500 MG TAB: 500 | 30 days supply | Qty: 40 | Fill #0

## 2019-08-16 MED FILL — CITALOPRAM HBR 40 MG TABLET: 40 | 90 days supply | Qty: 90 | Fill #2

## 2019-10-25 MED FILL — buPROPion HCL ER (XL) 150 M: 150 | 90 days supply | Qty: 90 | Fill #1

## 2019-12-27 ENCOUNTER — Other Ambulatory Visit: Payer: Self-pay | Admitting: Family Medicine

## 2019-12-27 MED FILL — CITALOPRAM HBR 40 MG TABLET: 40 | 90 days supply | Qty: 90 | Fill #0

## 2020-01-24 ENCOUNTER — Encounter: Payer: Self-pay | Admitting: Family Medicine

## 2020-01-24 ENCOUNTER — Telehealth (INDEPENDENT_AMBULATORY_CARE_PROVIDER_SITE_OTHER): Payer: Medicaid Other | Admitting: Family Medicine

## 2020-01-24 DIAGNOSIS — F419 Anxiety disorder, unspecified: Secondary | ICD-10-CM

## 2020-01-24 DIAGNOSIS — J302 Other seasonal allergic rhinitis: Secondary | ICD-10-CM | POA: Diagnosis not present

## 2020-01-24 DIAGNOSIS — J329 Chronic sinusitis, unspecified: Secondary | ICD-10-CM

## 2020-01-24 MED ORDER — PREDNISONE 50 MG PO TABS: ORAL_TABLET | ORAL | 0 refills | Status: DC

## 2020-01-24 MED ORDER — DOXYCYCLINE HYCLATE 100 MG PO TABS: 100.0000 mg | ORAL_TABLET | Freq: Two times a day (BID) | ORAL | 0 refills | Status: DC

## 2020-01-24 MED FILL — DOXYCYCLINE HYCLATE 100 MG: 100 | 7 days supply | Qty: 14 | Fill #0

## 2020-01-24 MED FILL — predniSONE 50 MG TABS: 50 | 5 days supply | Qty: 5 | Fill #0

## 2020-01-24 NOTE — Assessment & Plan Note (Signed)
Worsened.  Will place referral to ENT.  Likely has sinus infection I will treat as above.

## 2020-01-24 NOTE — Assessment & Plan Note (Signed)
Stable.  Continue Wellbutrin 150 mg daily and Celexa 40 mg daily.

## 2020-01-24 NOTE — Progress Notes (Signed)
   Teresa Harvey is a 39 y.o. female who presents today for a telephone visit.  Assessment/Plan:  New/Acute Problems: Chronic sinusitis We will start prednisone and doxycycline.  Given prolonged nature and recurrent nature we will place referral to ENT.  Discussed reasons to return to care.  Chronic Problems Addressed Today: Seasonal allergies Worsened.  Will place referral to ENT.  Likely has sinus infection I will treat as above.  Anxiety Stable.  Continue Wellbutrin 150 mg daily and Celexa 40 mg daily.    Subjective:  HPI:  Patient with flare of up of her allergic rhinitits. She is concerned she may have a sinus infection. Symptoms started a few months ago.  She has tried cardiology medications and nasal sprays with no improvement.  She has pain and pressure.  No fevers.  Worsening headaches.  No weakness or numbness.       Objective/Observations   NAD  Telephone Visit   I connected with Teresa Harvey on 01/24/20 at  4:00 PM EDT via telephone and verified that I am speaking with the correct person using two identifiers. I discussed the limitations of evaluation and management by telemedicine and the availability of in person appointments. The patient expressed understanding and agreed to proceed.   Patient location: Home Provider location: [place] [facility] [employer] Persons participating in the virtual visit: Myself and Patient  A total of 8 minutes were spent on medical discussion.      Katina Degree. [name], MD 01/24/2020 1:13 PM

## 2020-01-27 MED FILL — VALACYCLOVIR HCL 500 MG TAB: 500 | 30 days supply | Qty: 40 | Fill #1

## 2020-01-29 ENCOUNTER — Encounter: Payer: Self-pay | Admitting: Family Medicine

## 2020-02-01 ENCOUNTER — Telehealth: Payer: Self-pay | Admitting: *Deleted

## 2020-02-01 NOTE — Telephone Encounter (Signed)
Pt notified Exemption form at form office ready to be pick up

## 2020-02-05 ENCOUNTER — Emergency Department (HOSPITAL_COMMUNITY): Payer: Medicaid Other

## 2020-02-05 ENCOUNTER — Emergency Department (HOSPITAL_COMMUNITY)
Admission: EM | Admit: 2020-02-05 | Discharge: 2020-02-05 | Disposition: A | Discharge status: Home / Self Care | Payer: Medicaid Other | Attending: Emergency Medicine | Admitting: Emergency Medicine

## 2020-02-05 ENCOUNTER — Other Ambulatory Visit: Payer: Self-pay

## 2020-02-05 ENCOUNTER — Encounter (HOSPITAL_COMMUNITY): Payer: Self-pay | Admitting: Student

## 2020-02-05 DIAGNOSIS — Z91040 Latex allergy status: Secondary | ICD-10-CM | POA: Diagnosis not present

## 2020-02-05 DIAGNOSIS — R059 Cough, unspecified: Secondary | ICD-10-CM

## 2020-02-05 DIAGNOSIS — Z79899 Other long term (current) drug therapy: Secondary | ICD-10-CM | POA: Diagnosis not present

## 2020-02-05 DIAGNOSIS — U071 COVID-19: Secondary | ICD-10-CM | POA: Insufficient documentation

## 2020-02-05 DIAGNOSIS — R05 Cough: Secondary | ICD-10-CM | POA: Diagnosis present

## 2020-02-05 LAB — SARS CORONAVIRUS 2 BY RT PCR (HOSPITAL ORDER, PERFORMED IN ~~LOC~~ HOSPITAL LAB): SARS Coronavirus 2: POSITIVE — AB

## 2020-02-05 MED ORDER — FLUTICASONE PROPIONATE 50 MCG/ACT NA SUSP: 1.0000 | Freq: Every day | NASAL | 0 refills | Status: DC

## 2020-02-05 MED ORDER — BENZONATATE 100 MG PO CAPS: 100.0000 mg | ORAL_CAPSULE | Freq: Three times a day (TID) | ORAL | 0 refills | Status: DC

## 2020-02-05 NOTE — Discharge Instructions (Signed)
We have tested you for COVID 19, we will call you within the next 72 hours if results are positive, you may also view these results on MyChart.   We are instructing patient's with COVID 19 or symptoms of COVID 19 to quarantine themselves for 14 days. You may be able to discontinue self quarantine if the following conditions are met:   Persons with COVID-19 who have symptoms and were directed to care for themselves at home may discontinue home isolation under the  following conditions: - It has been at least 7 days have passed since symptoms first appeared. - AND at least 3 days (72 hours) have passed since recovery defined as resolution of fever without the use of fever-reducing medications and improvement in respiratory symptoms (e.g., cough, shortness of breath)  Please follow the below quarantine instructions.   We are sending you home with Flonase to use 1 spray per nostril daily to help with congestion as well as Tessalon to take every 8 hours as needed for coughing.  Please take Tylenol and or Motrin per over-the-counter dosing to help with any fever or achiness.  We have prescribed you new medication(s) today. Discuss the medications prescribed today with your pharmacist as they can have adverse effects and interactions with your other medicines including over the counter and prescribed medications. Seek medical evaluation if you start to experience new or abnormal symptoms after taking one of these medicines, seek care immediately if you start to experience difficulty breathing, feeling of your throat closing, facial swelling, or rash as these could be indications of a more serious allergic reaction  Your chest x-ray today was normal. Please follow up with primary care within 3-5 days for re-evaluation- call prior to going to the office to make them aware of your symptoms as some offices are altering their method of seeing patients with COVID 19 symptoms. Return to the ER for new or worsening  symptoms including but not limited to increased work of breathing, chest pain, passing out, or any other concerns.       [name]  Location: [address]  [place] [address]   Infection Prevention Recommendations for Individuals Confirmed to have, or Being Evaluated for, 2019 Novel Coronavirus (COVID-19) Infection Who Receive Care at Home  Individuals who are confirmed to have, or are being evaluated for, COVID-19 should follow the prevention steps below until a healthcare provider or local or state health department says they can return to normal activities.  Stay home except to get medical care You should restrict activities outside your home, except for getting medical care. Do not go to work, school, or public areas, and do not use public transportation or taxis.  Call ahead before visiting your doctor Before your medical appointment, call the healthcare provider and tell them that you have, or are being evaluated for, COVID-19 infection. This will help the healthcare provider's office take steps to keep other people from getting infected. Ask your healthcare provider to call the local or state health department.  Monitor your symptoms Seek prompt medical attention if your illness is worsening (e.g., difficulty breathing). Before going to your medical appointment, call the healthcare provider and tell them that you have, or are being evaluated for, COVID-19 infection. Ask your healthcare provider to call the local or state health department.  Wear a facemask You should wear a facemask that covers your nose and mouth when you are in the same room with other people and when  you visit a healthcare provider. People who live with or visit you should also wear a facemask while they are in the same room with you.  Separate yourself from other people in your home As much as possible, you should stay in a different room from other  people in your home. Also, you should use a separate bathroom, if available.  Avoid sharing household items You should not share dishes, drinking glasses, cups, eating utensils, towels, bedding, or other items with other people in your home. After using these items, you should wash them thoroughly with soap and water.  Cover your coughs and sneezes Cover your mouth and nose with a tissue when you cough or sneeze, or you can cough or sneeze into your sleeve. Throw used tissues in a lined trash can, and immediately wash your hands with soap and water for at least 20 seconds or use an alcohol-based hand rub.  Wash your [facility] your hands often and thoroughly with soap and water for at least 20 seconds. You can use an alcohol-based hand sanitizer if soap and water are not available and if your hands are not visibly dirty. Avoid touching your eyes, nose, and mouth with unwashed hands.   Prevention Steps for Caregivers and Household Members of Individuals Confirmed to have, or Being Evaluated for, COVID-19 Infection Being Cared for in the Home  If you live with, or provide care at home for, a person confirmed to have, or being evaluated for, COVID-19 infection please follow these guidelines to prevent infection:  Follow healthcare provider's instructions Make sure that you understand and can help the patient follow any healthcare provider instructions for all care.  Provide for the patient's basic needs You should help the patient with basic needs in the home and provide support for getting groceries, prescriptions, and other personal needs.  Monitor the patient's symptoms If they are getting sicker, call his or her medical provider and tell them that the patient has, or is being evaluated for, COVID-19 infection. This will help the healthcare provider's office take steps to keep other people from getting infected. Ask the healthcare provider to call the local or state health  department.  Limit the number of people who have contact with the patient If possible, have only one caregiver for the patient. Other household members should stay in another home or place of residence. If this is not possible, they should stay in another room, or be separated from the patient as much as possible. Use a separate bathroom, if available. Restrict visitors who do not have an essential need to be in the home.  Keep older adults, very young children, and other sick people away from the patient Keep older adults, very young children, and those who have compromised immune systems or chronic health conditions away from the patient. This includes people with chronic heart, lung, or kidney conditions, diabetes, and cancer.  Ensure good ventilation Make sure that shared spaces in the home have good air flow, such as from an air conditioner or an opened window, weather permitting.  Wash your hands often Wash your hands often and thoroughly with soap and water for at least 20 seconds. You can use an alcohol based hand sanitizer if soap and water are not available and if your hands are not visibly dirty. Avoid touching your eyes, nose, and mouth with unwashed hands. Use disposable paper towels to dry your hands. If not available, use dedicated cloth towels and replace them when they become wet.  Wear a facemask and gloves Wear a disposable facemask at all times in the room and gloves when you touch or have contact with the patient's blood, body fluids, and/or secretions or excretions, such as sweat, saliva, sputum, nasal mucus, vomit, urine, or feces.  Ensure the mask fits over your nose and mouth tightly, and do not touch it during use. Throw out disposable facemasks and gloves after using them. Do not reuse. Wash your hands immediately after removing your facemask and gloves. If your personal clothing becomes contaminated, carefully remove clothing and launder. Wash your hands after  handling contaminated clothing. Place all used disposable facemasks, gloves, and other waste in a lined container before disposing them with other household waste. Remove gloves and wash your hands immediately after handling these items.  Do not share dishes, glasses, or other household items with the patient Avoid sharing household items. You should not share dishes, drinking glasses, cups, eating utensils, towels, bedding, or other items with a patient who is confirmed to have, or being evaluated for, COVID-19 infection. After the person uses these items, you should wash them thoroughly with soap and water.  Wash laundry thoroughly Immediately remove and wash clothes or bedding that have blood, body fluids, and/or secretions or excretions, such as sweat, saliva, sputum, nasal mucus, vomit, urine, or feces, on them. Wear gloves when handling laundry from the patient. Read and follow directions on labels of laundry or clothing items and detergent. In general, wash and dry with the warmest temperatures recommended on the label.  Clean all areas the individual has used often Clean all touchable surfaces, such as counters, tabletops, doorknobs, bathroom fixtures, toilets, phones, keyboards, tablets, and bedside tables, every day. Also, clean any surfaces that may have blood, body fluids, and/or secretions or excretions on them. Wear gloves when cleaning surfaces the patient has come in contact with. Use a diluted bleach solution (e.g., dilute bleach with 1 part bleach and 10 parts water) or a household disinfectant with a label that says EPA-registered for coronaviruses. To make a bleach solution at home, add 1 tablespoon of bleach to 1 quart (4 cups) of water. For a larger supply, add  cup of bleach to 1 gallon (16 cups) of water. Read labels of cleaning products and follow recommendations provided on product labels. Labels contain instructions for safe and effective use of the cleaning product  including precautions you should take when applying the product, such as wearing gloves or eye protection and making sure you have good ventilation during use of the product. Remove gloves and wash hands immediately after cleaning.  Monitor yourself for signs and symptoms of illness Caregivers and household members are considered close contacts, should monitor their health, and will be asked to limit movement outside of the home to the extent possible. Follow the monitoring steps for close contacts listed on the symptom monitoring form.   ? If you have additional questions, contact your local health department or call the epidemiologist on call at [phone] (available 24/7). ? This guidance is subject to change. For the most up-to-date guidance from [facility], please refer to their website: [email or web address]

## 2020-02-05 NOTE — ED Provider Notes (Signed)
[facility]-EMERGENCY DEPT Provider Note   CSN: [id number] Arrival date & time: 02/05/20  2620     History Chief Complaint  Patient presents with  . Covid Exposure     Teresa Harvey is a 39 y.o. female with a history of anxiety & dyslipidemia who presents to the ED with complaints of not feeling well for the past 3 to 4 days.  Patient states that she has had fever, chills, body aches, nasal congestion, and cough that is dry.  She does feel a bit short of breath with her coughing spells otherwise no significant dyspnea.  She is not vaccinated against COVID-19, her child's father tested positive earlier this week.  She denies chest pain, syncope, vomiting, abdominal pain, leg pain/swelling, hemoptysis, recent surgery/trauma, recent long travel, hormone use, personal hx of cancer, or hx of DVT/PE.   HPI     Past Medical History:  Diagnosis Date  . Cervical dysplasia 2004   no treatment, normal paps after  . Eczema   . HSV infection   . Hx of gonorrhea   . Medical history non-contributory   . Pregnancy induced hypertension   . Vaginal Pap smear, abnormal     Patient Active Problem List   Diagnosis Date Noted  . Dyslipidemia 11/11/2018  . Anxiety 06/16/2018  . 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 Seasonal allergies 09/09/2014  . HSV infection     Past Surgical History:  Procedure Laterality Date  . CESAREAN SECTION N/A 03/21/2017   Procedure: Primary CESAREAN SECTION;  Surgeon: Shea Evans, MD;  Location: [facility] BIRTHING SUITES;  Service: Obstetrics;  Laterality: N/A;  EDD: 03/28/17 Allergy: Penicillin   . COLPOSCOPY    . ESOPHAGOGASTRODUODENOSCOPY       OB History    Gravida  3   Para      Term      Preterm      AB  2   Living  0     SAB      TAB  2   Ectopic      Multiple      Live Births              Family History  Problem Relation Age of Onset  . Heart disease Father   . Heart attack Father   . Eczema  Father   . Hypertension Maternal Grandmother   . Cerebral palsy Maternal Grandmother   . Bipolar disorder Mother   . Asthma Mother   . Allergic rhinitis Neg Hx   . Angioedema Neg Hx   . Urticaria Neg Hx     Social History   Tobacco Use  . Smoking status: Never Smoker  . Smokeless tobacco: Never Used  Substance Use Topics  . Alcohol use: No    Alcohol/week: 0.0 standard drinks  . Drug use: No    Home Medications Prior to Admission medications   Medication Sig Start Date End Date Taking? Authorizing Provider  buPROPion (WELLBUTRIN XL) 150 MG 24 hr tablet Take 1 tablet (150 mg total) by mouth daily. 06/16/19   Ardith Dark, MD  citalopram (CELEXA) 40 MG tablet TAKE 1 TABLET BY MOUTH ONCE DAILY 12/27/19   Ardith Dark, MD  doxycycline (VIBRA-TABS) 100 MG tablet Take 1 tablet (100 mg total) by mouth 2 (two) times daily. 01/24/20   Ardith Dark, MD  ibuprofen (ADVIL,MOTRIN) 600 MG tablet Take 1 tablet (600 mg total) by mouth every 6 (six) hours. 03/23/17   Renae Fickle,  Daniela C, CNM  ipratropium (ATROVENT) 0.06 % nasal spray Place 2 sprays into both nostrils 4 (four) times daily. 08/12/18   Ardith Dark, MD  predniSONE (DELTASONE) 50 MG tablet Take 1 tablet daily for 5 days. 01/24/20   Ardith Dark, MD  valACYclovir (VALTREX) 500 MG tablet Take 500 mg by mouth daily. 02/13/17   [provider]    Allergies    Penicillins and Latex  Review of Systems   Review of Systems  Constitutional: Positive for chills and fever.  HENT: Positive for congestion. Negative for ear pain and sore throat.   Respiratory: Positive for cough and shortness of breath.   Cardiovascular: Negative for chest pain and leg swelling.  Gastrointestinal: Negative for abdominal pain, nausea and vomiting.  Musculoskeletal: Positive for myalgias.  Neurological: Negative for syncope.    Physical Exam Updated Vital Signs BP 131/79 (BP Location: Right Arm)   Pulse 82   Temp 98.1 F (36.7 C) (Oral)    Resp 16   Ht 5\' 6"  (1.676 m)   Wt 102.5 kg   SpO2 100%   BMI 36.48 kg/m   Physical Exam Vitals and nursing note reviewed.  Constitutional:      General: She is not in acute distress.    Appearance: She is well-developed.  HENT:     Head: Normocephalic and atraumatic.     Right Ear: Ear canal normal. Tympanic membrane is not perforated, erythematous, retracted or bulging.     Left Ear: Ear canal normal. Tympanic membrane is not perforated, erythematous, retracted or bulging.     Ears:     Comments: No mastoid erythema/swelling/tenderness.     Nose:     Right Sinus: No maxillary sinus tenderness or frontal sinus tenderness.     Left Sinus: No maxillary sinus tenderness or frontal sinus tenderness.     Mouth/Throat:     Pharynx: Uvula midline. No oropharyngeal exudate or posterior oropharyngeal erythema.     Comments: Posterior oropharynx is symmetric appearing. Patient tolerating own secretions without difficulty. No trismus. No drooling. No hot potato voice. No swelling beneath the tongue, submandibular compartment is soft.  Eyes:     General:        Right eye: No discharge.        Left eye: No discharge.     Conjunctiva/sclera: Conjunctivae normal.     Pupils: Pupils are equal, round, and reactive to light.  Cardiovascular:     Rate and Rhythm: Normal rate and regular rhythm.     Heart sounds: No murmur heard.   Pulmonary:     Effort: Pulmonary effort is normal. No respiratory distress.     Breath sounds: Normal breath sounds. No wheezing, rhonchi or rales.  Abdominal:     General: There is no distension.     Palpations: Abdomen is soft.     Tenderness: There is no abdominal tenderness.  Musculoskeletal:     Cervical back: Normal range of motion and neck supple. No edema or rigidity.  Lymphadenopathy:     Cervical: No cervical adenopathy.  Skin:    General: Skin is warm and dry.     Findings: No rash.  Neurological:     Mental Status: She is alert.  Psychiatric:         Behavior: Behavior normal.     ED Results / Procedures / Treatments   Labs (all labs ordered are listed, but only abnormal results are displayed) Labs Reviewed - No data to display  EKG None  Radiology DG Chest Port 1 View  Result Date: 02/05/2020 CLINICAL DATA:  Fever.  Cough. EXAM: PORTABLE CHEST 1 VIEW COMPARISON:  January 13, 2019 FINDINGS: The heart size and mediastinal contours are within normal limits. Both lungs are clear. The visualized skeletal structures are unremarkable. IMPRESSION: No active disease. Electronically Signed   By: Gerome Sam III M.D   On: 02/05/2020 13:11    Procedures Procedures (including critical care time)  Medications Ordered in ED Medications - No data to display  ED Course  I have reviewed the triage vital signs and the nursing notes.  Pertinent labs & imaging results that were available during my care of the patient were reviewed by me and considered in my medical decision making (see chart for details).    Teresa Harvey was evaluated in Emergency Department on 02/05/2020 for the symptoms described in the history of present illness. He/she was evaluated in the context of the global COVID-19 pandemic, which necessitated consideration that the patient might be at risk for infection with the SARS-CoV-2 virus that causes COVID-19. Institutional protocols and algorithms that pertain to the evaluation of patients at risk for COVID-19 are in a state of rapid change based on information released by regulatory bodies including the CDC and federal and state organizations. These policies and algorithms were followed during the patient's care in the ED.  MDM Rules/Calculators/A&P                          Patient presents to the ED with complaints of not feeling well for the past 3 days. Nontoxic, vitals WNL. Benign exam.  Patient has no signs of AOM, AOE, or mastoiditis.  Oropharynx is clear therefore doubt strep pharyngitis.  Symptoms for less than  7 days making bacterial sinusitis less likely.  She has no meningismus.  Her lungs are clear to auscultation bilaterally and she has no focal infiltrate on chest x-ray to suggest community-acquired pneumonia-I ordered, personally reviewed, and interpreted chest x-ray, agree with radiologist interpretation.  No signs of respiratory distress.  She is able to ambulate and maintain SPO2 greater than 95% on room air.  She is low risk Wells, PERC negative, doubt pulmonary embolism.  Overall suspect viral, most likely COVID-19 with her recent exposure.  She does not appear to require admission for respiratory support at this time.  Will discharge home with supportive care and quarantine instructions with Covid test pending at this time I discussed results, treatment plan, need for follow-up, and return precautions with the patient. Provided opportunity for questions, patient confirmed understanding and is in agreement with plan.   Final Clinical Impression(s) / ED Diagnoses Final diagnoses:  Cough    Rx / DC Orders ED Discharge Orders         Ordered    benzonatate (TESSALON) 100 MG capsule  Every 8 hours        02/05/20 1322    fluticasone (FLONASE) 50 MCG/ACT nasal spray  Daily        02/05/20 [address] [place], PA-C 02/05/20 1323    Alvira Monday, MD 02/07/20 1035

## 2020-02-05 NOTE — ED Triage Notes (Signed)
Patient reports headache, cough, body aches since Wednesday. Says the father of her child tested positive for COVID earlier this week.

## 2020-02-07 ENCOUNTER — Encounter: Payer: Self-pay | Admitting: Oncology

## 2020-02-07 ENCOUNTER — Other Ambulatory Visit: Payer: Self-pay | Admitting: Oncology

## 2020-02-07 ENCOUNTER — Telehealth: Payer: Self-pay | Admitting: Unknown Physician Specialty

## 2020-02-07 DIAGNOSIS — U071 COVID-19: Secondary | ICD-10-CM

## 2020-02-07 NOTE — Progress Notes (Signed)
I connected by phone with  Mrs. Teresa Harvey to discuss the potential use of an new treatment for mild to moderate COVID-19 viral infection in non-hospitalized patients.   This patient is a age/sex that meets the FDA criteria for Emergency Use Authorization of casirivimab\imdevimab.  Has a (+) direct SARS-CoV-2 viral test result 1. Has mild or moderate COVID-19  2. Is ? 40 years of age and weighs ? 40 kg 3. Is NOT hospitalized due to COVID-19 4. Is NOT requiring oxygen therapy or requiring an increase in baseline oxygen flow rate due to COVID-19 5. Is within 10 days of symptom onset 6. Has at least one of the high risk factor(s) for progression to severe COVID-19 and/or hospitalization as defined in EUA. ? Specific high risk criteria : Obesity, CAD   Symptom onset  02/02/20.     I have spoken and communicated the following to the patient or parent/caregiver:   1. FDA has authorized the emergency use of casirivimab\imdevimab for the treatment of mild to moderate COVID-19 in adults and pediatric patients with positive results of direct SARS-CoV-2 viral testing who are 49 years of age and older weighing at least 40 kg, and who are at high risk for progressing to severe COVID-19 and/or hospitalization.   2. The significant known and potential risks and benefits of casirivimab\imdevimab, and the extent to which such potential risks and benefits are unknown.   3. Information on available alternative treatments and the risks and benefits of those alternatives, including clinical trials.   4. Patients treated with casirivimab\imdevimab should continue to self-isolate and use infection control measures (e.g., wear mask, isolate, social distance, avoid sharing personal items, clean and disinfect "high touch" surfaces, and frequent handwashing) according to CDC guidelines.    5. The patient or parent/caregiver has the option to accept or refuse casirivimab\imdevimab .   After reviewing this information  with the patient, The patient agreed to proceed with receiving casirivimab\imdevimab infusion and will be provided a copy of the Fact sheet prior to receiving the infusion.[name], AGNP-C [phone] (Infusion Center Hotline)

## 2020-02-07 NOTE — Telephone Encounter (Signed)
Called to Discuss with patient about Covid symptoms and the use of the monoclonal antibody infusion for those with mild to moderate Covid symptoms and at a high risk of hospitalization.     Pt appears to qualify for this infusion due to co-morbid conditions and/or a member of an at-risk group in accordance with the FDA Emergency Use Authorization.    Unable to reach pt    

## 2020-02-08 ENCOUNTER — Ambulatory Visit (HOSPITAL_COMMUNITY)
Admission: RE | Admit: 2020-02-08 | Discharge: 2020-02-08 | Disposition: A | Discharge status: Home / Self Care | Payer: Medicaid Other | Source: Ambulatory Visit | Attending: Pulmonary Disease | Admitting: Pulmonary Disease

## 2020-02-08 DIAGNOSIS — U071 COVID-19: Secondary | ICD-10-CM | POA: Insufficient documentation

## 2020-02-08 MED ORDER — SODIUM CHLORIDE 0.9 % IV SOLN
1200.0000 mg | Freq: Once | INTRAVENOUS | Status: AC
  Administered 2020-02-08: 1200 mg via INTRAVENOUS

## 2020-02-08 MED ORDER — ALBUTEROL SULFATE HFA 108 (90 BASE) MCG/ACT IN AERS: 2.0000 | INHALATION_SPRAY | Freq: Once | RESPIRATORY_TRACT | Status: DC | PRN

## 2020-02-08 MED ORDER — EPINEPHRINE 0.3 MG/0.3ML IJ SOAJ: 0.3000 mg | Freq: Once | INTRAMUSCULAR | Status: DC | PRN

## 2020-02-08 MED ORDER — DIPHENHYDRAMINE HCL 50 MG/ML IJ SOLN: 50.0000 mg | Freq: Once | INTRAMUSCULAR | Status: DC | PRN

## 2020-02-08 MED ORDER — SODIUM CHLORIDE 0.9 % IV SOLN: INTRAVENOUS | Status: DC | PRN

## 2020-02-08 MED ORDER — METHYLPREDNISOLONE SODIUM SUCC 125 MG IJ SOLR: 125.0000 mg | Freq: Once | INTRAMUSCULAR | Status: DC | PRN

## 2020-02-08 MED ORDER — FAMOTIDINE IN NACL 20-0.9 MG/50ML-% IV SOLN: 20.0000 mg | Freq: Once | INTRAVENOUS | Status: DC | PRN

## 2020-02-08 NOTE — Discharge Instructions (Signed)

## 2020-02-08 NOTE — Progress Notes (Signed)
  Diagnosis: COVID-19  Physician: Dr. Delford Field  Procedure: Covid Infusion Clinic Med: casirivimab\imdevimab infusion - Provided patient with casirivimab\imdevimab fact sheet for patients, parents and caregivers prior to infusion.  Complications: No immediate complications noted.  Discharge: Discharged home   Maree Erie [facility] 02/08/2020

## 2020-03-13 ENCOUNTER — Encounter: Payer: Self-pay | Admitting: Family Medicine

## 2020-03-14 NOTE — Telephone Encounter (Signed)
Form at front office ready to be pick up  Patient aware

## 2020-05-25 ENCOUNTER — Other Ambulatory Visit: Payer: Self-pay | Admitting: Family Medicine

## 2020-05-25 ENCOUNTER — Other Ambulatory Visit (HOSPITAL_COMMUNITY)
Admission: RE | Admit: 2020-05-25 | Discharge: 2020-05-25 | Disposition: A | Discharge status: Home / Self Care | Payer: Medicaid Other | Source: Ambulatory Visit | Attending: Family Medicine | Admitting: Family Medicine

## 2020-05-25 ENCOUNTER — Encounter: Payer: Self-pay | Admitting: Family Medicine

## 2020-05-25 ENCOUNTER — Ambulatory Visit (INDEPENDENT_AMBULATORY_CARE_PROVIDER_SITE_OTHER): Payer: Medicaid Other | Admitting: Family Medicine

## 2020-05-25 ENCOUNTER — Other Ambulatory Visit: Payer: Self-pay

## 2020-05-25 VITALS — BP 128/80 | HR 81 | Temp 98.2°F | Ht 66.0 in | Wt 226.4 lb

## 2020-05-25 DIAGNOSIS — L709 Acne, unspecified: Secondary | ICD-10-CM | POA: Insufficient documentation

## 2020-05-25 DIAGNOSIS — Z0001 Encounter for general adult medical examination with abnormal findings: Secondary | ICD-10-CM | POA: Diagnosis not present

## 2020-05-25 DIAGNOSIS — F419 Anxiety disorder, unspecified: Secondary | ICD-10-CM | POA: Diagnosis not present

## 2020-05-25 DIAGNOSIS — J302 Other seasonal allergic rhinitis: Secondary | ICD-10-CM

## 2020-05-25 DIAGNOSIS — E785 Hyperlipidemia, unspecified: Secondary | ICD-10-CM | POA: Diagnosis not present

## 2020-05-25 DIAGNOSIS — Z6836 Body mass index (BMI) 36.0-36.9, adult: Secondary | ICD-10-CM

## 2020-05-25 DIAGNOSIS — Z124 Encounter for screening for malignant neoplasm of cervix: Secondary | ICD-10-CM

## 2020-05-25 DIAGNOSIS — L708 Other acne: Secondary | ICD-10-CM

## 2020-05-25 DIAGNOSIS — E669 Obesity, unspecified: Secondary | ICD-10-CM

## 2020-05-25 MED ORDER — BUPROPION HCL ER (XL) 150 MG PO TB24: 150.0000 mg | ORAL_TABLET | Freq: Every day | ORAL | 3 refills | Status: AC

## 2020-05-25 MED ORDER — CITALOPRAM HYDROBROMIDE 40 MG PO TABS
40.0000 mg | ORAL_TABLET | Freq: Every day | ORAL | 3 refills | Status: DC
Start: 2020-05-25 — End: 2020-05-25

## 2020-05-25 MED FILL — CITALOPRAM HBR 40 MG TABLET: 40 | 90 days supply | Qty: 90 | Fill #0

## 2020-05-25 MED FILL — buPROPion HCL ER (XL) 150 M: 150 | 90 days supply | Qty: 90 | Fill #0

## 2020-05-25 NOTE — Progress Notes (Signed)
Chief Complaint:  Teresa Harvey is a 39 y.o. female who presents today for her annual comprehensive physical exam.    Assessment/Plan:  Chronic Problems Addressed Today: Seasonal allergies Has referral to ENT pending.  Gave contact information today.  Acne Recommended facial wash and body wash product with benzoyl peroxide or salicylic acid.  Recommended use of non-comedogenic moisturizers.  Dyslipidemia Continue lifestyle modifications.  Anxiety Doing well.  Will refill Wellbutrin 150 mg daily and Celexa 40 mg daily.   Body mass index is 36.54 kg/m. / Obese  BMI Metric Follow Up - 05/25/20 0859      BMI Metric Follow Up-Please document annually   BMI Metric Follow Up Education provided            Preventative Healthcare: Pap performed today.  Declines blood work.  Patient Counseling(The following topics were reviewed and/or handout was given):  -Nutrition: Stressed importance of moderation in sodium/caffeine intake, saturated fat and cholesterol, caloric balance, sufficient intake of fresh fruits, vegetables, and fiber.  -Stressed the importance of regular exercise.   -Substance Abuse: Discussed cessation/primary prevention of tobacco, alcohol, or other drug use; driving or other dangerous activities under the influence; availability of treatment for abuse.   -Injury prevention: Discussed safety belts, safety helmets, smoke detector, smoking near bedding or upholstery.   -Sexuality: Discussed sexually transmitted diseases, partner selection, use of condoms, avoidance of unintended pregnancy and contraceptive alternatives.   -Dental health: Discussed importance of regular tooth brushing, flossing, and dental visits.  -Health maintenance and immunizations reviewed. Please refer to Health maintenance section.  Return to care in 1 year for next preventative visit.     Subjective:  HPI:  She has no acute complaints today.   Lifestyle Diet: No specific diets or  eating plans.  Exercise: None specific.   Depression screen PHQ 2/9 05/25/2020  Decreased Interest 0  Down, Depressed, Hopeless 0  PHQ - 2 Score 0  Altered sleeping -  Tired, decreased energy -  Change in appetite -  Feeling bad or failure about yourself  -  Trouble concentrating -  Moving slowly or fidgety/restless -  Suicidal thoughts -  PHQ-9 Score -  Difficult doing work/chores -    Health Maintenance Due  Topic Date Due  . PAP SMEAR-Modifier  01/27/2016     ROS: Per HPI, otherwise a complete review of systems was negative.   PMH:  The following were reviewed and entered/updated in epic: Past Medical History:  Diagnosis Date  . Cervical dysplasia 2004   no treatment, normal paps after  . Eczema   . HSV infection   . Hx of gonorrhea   . Medical history non-contributory   . Pregnancy induced hypertension   . Vaginal Pap smear, abnormal    Patient Active Problem List   Diagnosis Date Noted  . Acne 05/25/2020  . Dyslipidemia 11/11/2018  . Anxiety 06/16/2018  . 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 Seasonal allergies 09/09/2014  . HSV infection    Past Surgical History:  Procedure Laterality Date  . CESAREAN SECTION N/A 03/21/2017   Procedure: Primary CESAREAN SECTION;  Surgeon: Shea Evans, MD;  Location: [facility] BIRTHING SUITES;  Service: Obstetrics;  Laterality: N/A;  EDD: 03/28/17 Allergy: Penicillin   . COLPOSCOPY    . ESOPHAGOGASTRODUODENOSCOPY      Family History  Problem Relation Age of Onset  . Heart disease Father   . Heart attack Father   . Eczema Father   . Hypertension Maternal Grandmother   .  Cerebral palsy Maternal Grandmother   . Bipolar disorder Mother   . Asthma Mother   . Allergic rhinitis Neg Hx   . Angioedema Neg Hx   . Urticaria Neg Hx     Medications- reviewed and updated Current Outpatient Medications  Medication Sig Dispense Refill  . valACYclovir (VALTREX) 500 MG tablet Take 500 mg by mouth daily.  3  . buPROPion  (WELLBUTRIN XL) 150 MG 24 hr tablet Take 1 tablet (150 mg total) by mouth daily. 90 tablet 3  . citalopram (CELEXA) 40 MG tablet Take 1 tablet (40 mg total) by mouth daily. 90 tablet 3   No current facility-administered medications for this visit.    Allergies-reviewed and updated Allergies  Allergen Reactions  . Penicillins Swelling    REACTION: tongue swealling Has patient had a PCN reaction causing immediate rash, facial/tongue/throat swelling, SOB or lightheadedness with hypotension: yes Has patient had a PCN reaction causing severe rash involving mucus membranes or skin necrosis: no Has patient had a PCN reaction that required hospitalization: yes dr's office visit Has patient had a PCN reaction occurring within the last 10 years: no If all of the above answers are "NO", then may proceed with Cephalosporin use.   . Latex Itching and Rash    Redness and itching with latex exposure    Social History   Socioeconomic History  . Marital status: Single    Spouse name: Not on file  . Number of children: 0  . Years of education: 94  . Highest education level: Not on file  Occupational History  . Occupation: Lab  Tobacco Use  . Smoking status: Never Smoker  . Smokeless tobacco: Never Used  Substance and Sexual Activity  . Alcohol use: No    Alcohol/week: 0.0 standard drinks  . Drug use: No  . Sexual activity: Yes    Birth control/protection: None    Comment: 1st intercourse 48 yo-5 partners  Other Topics Concern  . Not on file  Social History Narrative   Fun: play with her dog Dance movement psychotherapist)   Social Determinants of Health   Financial Resource Strain: Not on file  Food Insecurity: Not on file  Transportation Needs: Not on file  Physical Activity: Not on file  Stress: Not on file  Social Connections: Not on file        Objective:  Physical Exam: BP 128/80   Pulse 81   Temp 98.2 F (36.8 C) (Temporal)   Ht 5\' 6"  (1.676 m)   Wt 226 lb 6.4 oz (102.7 kg)    LMP 05/12/2020   SpO2 98%   BMI 36.54 kg/m   Body mass index is 36.54 kg/m. Wt Readings from Last 3 Encounters:  05/25/20 226 lb 6.4 oz (102.7 kg)  02/05/20 226 lb (102.5 kg)  01/14/19 190 lb (86.2 kg)   Gen: NAD, resting comfortably HEENT: TMs normal bilaterally. OP clear. No thyromegaly noted.  CV: RRR with no murmurs appreciated Pulm: NWOB, CTAB with no crackles, wheezes, or rhonchi GI: Normal bowel sounds present. Soft, Nontender, Nondistended. MSK: no edema, cyanosis, or clubbing noted GU: Normal external and internal female genitalia. Chaperone present for exam.  Skin: warm, dry Neuro: CN2-12 grossly intact. Strength 5/5 in upper and lower extremities. Reflexes symmetric and intact bilaterally.  Psych: Normal affect and thought content     Leniya Breit M. 03/16/19, MD 05/25/2020 9:03 AM

## 2020-05-25 NOTE — Assessment & Plan Note (Signed)
Continue lifestyle modifications. 

## 2020-05-25 NOTE — Assessment & Plan Note (Signed)
Has referral to ENT pending.  Gave contact information today.

## 2020-05-25 NOTE — Assessment & Plan Note (Signed)
Recommended facial wash and body wash product with benzoyl peroxide or salicylic acid.  Recommended use of non-comedogenic moisturizers.

## 2020-05-25 NOTE — Patient Instructions (Addendum)
It was very nice to see you today!  I will refill your medications today.  Please make sure that you are using a facial or body wash that has salicylic acid or benzoyl peroxide for your acne.  Please make sure that any moisturizer that use is non-comedogenic.  We will perform your Pap smear. The result shsould be back by next week.  Call your ENT at [phone].  I will see you back in 1 year. Please come back to see me sooner if needed.   Take care, Dr Jerline Pain  Please try these tips to maintain a healthy lifestyle:   Eat at least 3 REAL meals and 1-2 snacks per day.  Aim for no more than 5 hours between eating.  If you eat breakfast, please do so within one hour of getting up.    Each meal should contain half fruits/vegetables, one quarter protein, and one quarter carbs (no bigger than a computer mouse)   Cut down on sweet beverages. This includes juice, soda, and sweet tea.     Drink at least 1 glass of water with each meal and aim for at least 8 glasses per day   Exercise at least 150 minutes every week.    Preventive Care 25-20 Years Old, Female Preventive care refers to visits with your health care provider and lifestyle choices that can promote health and wellness. This includes:  A yearly physical exam. This may also be called an annual well check.  Regular dental visits and eye exams.  Immunizations.  Screening for certain conditions.  Healthy lifestyle choices, such as eating a healthy diet, getting regular exercise, not using drugs or products that contain nicotine and tobacco, and limiting alcohol use. What can I expect for my preventive care visit? Physical exam Your health care provider will check your:  Height and weight. This may be used to calculate body mass index (BMI), which tells if you are at a healthy weight.  Heart rate and blood pressure.  Skin for abnormal spots. Counseling Your health care provider may ask you questions about  your:  Alcohol, tobacco, and drug use.  Emotional well-being.  Home and relationship well-being.  Sexual activity.  Eating habits.  Work and work Statistician.  Method of birth control.  Menstrual cycle.  Pregnancy history. What immunizations do I need?  Influenza (flu) vaccine  This is recommended every year. Tetanus, diphtheria, and pertussis (Tdap) vaccine  You may need a Td booster every 10 years. Varicella (chickenpox) vaccine  You may need this if you have not been vaccinated. Human papillomavirus (HPV) vaccine  If recommended by your health care provider, you may need three doses over 6 months. Measles, mumps, and rubella (MMR) vaccine  You may need at least one dose of MMR. You may also need a second dose. Meningococcal conjugate (MenACWY) vaccine  One dose is recommended if you are age 84-21 years and a first-year college student living in a residence hall, or if you have one of several medical conditions. You may also need additional booster doses. Pneumococcal conjugate (PCV13) vaccine  You may need this if you have certain conditions and were not previously vaccinated. Pneumococcal polysaccharide (PPSV23) vaccine  You may need one or two doses if you smoke cigarettes or if you have certain conditions. Hepatitis A vaccine  You may need this if you have certain conditions or if you travel or work in places where you may be exposed to hepatitis A. Hepatitis B vaccine  You may need  this if you have certain conditions or if you travel or work in places where you may be exposed to hepatitis B. Haemophilus influenzae type b (Hib) vaccine  You may need this if you have certain conditions. You may receive vaccines as individual doses or as more than one vaccine together in one shot (combination vaccines). Talk with your health care provider about the risks and benefits of combination vaccines. What tests do I need?  Blood tests  Lipid and cholesterol  levels. These may be checked every 5 years starting at age 68.  Hepatitis C test.  Hepatitis B test. Screening  Diabetes screening. This is done by checking your blood sugar (glucose) after you have not eaten for a while (fasting).  Sexually transmitted disease (STD) testing.  BRCA-related cancer screening. This may be done if you have a family history of breast, ovarian, tubal, or peritoneal cancers.  Pelvic exam and Pap test. This may be done every 3 years starting at age 74. Starting at age 64, this may be done every 5 years if you have a Pap test in combination with an HPV test. Talk with your health care provider about your test results, treatment options, and if necessary, the need for more tests. Follow these instructions at home: Eating and drinking   Eat a diet that includes fresh fruits and vegetables, whole grains, lean protein, and low-fat dairy.  Take vitamin and mineral supplements as recommended by your health care provider.  Do not drink alcohol if: ? Your health care provider tells you not to drink. ? You are pregnant, may be pregnant, or are planning to become pregnant.  If you drink alcohol: ? Limit how much you have to 0-1 drink a day. ? Be aware of how much alcohol is in your drink. In the [place] one drink equals one 12 oz bottle of beer (355 mL), one 5 oz glass of wine (148 mL), or one 1 oz glass of hard liquor (44 mL). Lifestyle  Take daily care of your teeth and gums.  Stay active. Exercise for at least 30 minutes on 5 or more days each week.  Do not use any products that contain nicotine or tobacco, such as cigarettes, e-cigarettes, and chewing tobacco. If you need help quitting, ask your health care provider.  If you are sexually active, practice safe sex. Use a condom or other form of birth control (contraception) in order to prevent pregnancy and STIs (sexually transmitted infections). If you plan to become pregnant, see your health care provider for  a preconception visit. What's next?  Visit your health care provider once a year for a well check visit.  Ask your health care provider how often you should have your eyes and teeth checked.  Stay up to date on all vaccines. This information is not intended to replace advice given to you by your health care provider. Make sure you discuss any questions you have with your health care provider. Document Revised: 02/05/2018 Document Reviewed: 02/05/2018 Elsevier Patient Education  2020 [employer].

## 2020-05-25 NOTE — Assessment & Plan Note (Signed)
Doing well.  Will refill Wellbutrin 150 mg daily and Celexa 40 mg daily.

## 2020-05-26 LAB — CYTOLOGY - PAP
Adequacy: ABSENT
Comment: NEGATIVE
Diagnosis: NEGATIVE
High risk HPV: NEGATIVE

## 2020-05-26 NOTE — Progress Notes (Signed)
Please inform patient of the following:  Pap test was NORMAL. We should repeat in 5 years.  Teresa Harvey. [name], MD 05/26/2020 3:12 PM

## 2020-06-14 ENCOUNTER — Encounter: Payer: Self-pay | Admitting: Family Medicine

## 2020-06-14 NOTE — Telephone Encounter (Signed)
Please advise 

## 2020-06-15 ENCOUNTER — Telehealth (INDEPENDENT_AMBULATORY_CARE_PROVIDER_SITE_OTHER): Payer: Medicaid Other | Admitting: Family Medicine

## 2020-06-15 DIAGNOSIS — R0981 Nasal congestion: Secondary | ICD-10-CM | POA: Diagnosis not present

## 2020-06-15 DIAGNOSIS — R059 Cough, unspecified: Secondary | ICD-10-CM

## 2020-06-15 MED ORDER — DOXYCYCLINE HYCLATE 100 MG PO TABS: 100.0000 mg | ORAL_TABLET | Freq: Two times a day (BID) | ORAL | 0 refills | Status: DC

## 2020-06-15 MED ORDER — BENZONATATE 100 MG PO CAPS: 100.0000 mg | ORAL_CAPSULE | Freq: Three times a day (TID) | ORAL | 0 refills | Status: DC | PRN

## 2020-06-15 MED ORDER — ALBUTEROL SULFATE HFA 108 (90 BASE) MCG/ACT IN AERS: 2.0000 | INHALATION_SPRAY | Freq: Four times a day (QID) | RESPIRATORY_TRACT | 0 refills | Status: AC | PRN

## 2020-06-15 NOTE — Patient Instructions (Signed)
   ---------------------------------------------------------------------------------------------------------------------------      WORK SLIP:  Patient Teresa Harvey,  02-28-1981, was seen for a medical visit today, 06/15/20 . Please excuse from work according to the [facility] guidelines for a COVID like illness. We advise 10 days minimum from the onset of symptoms (06/09/20) PLUS 1 day of no fever and improved symptoms. Will defer to employer for a sooner return to work if greater than 5 days, symptoms have resolved and COVID19 testing is negative, but must ensure high quality mask is worn properly at all times for 5 additional days.   Sincerely: E-signature: Dr. Kriste Basque, DO [facility] Ph: [phone]   ------------------------------------------------------------------------------------------------------------------------------    HOME CARE TIPS:  -[place] COVID19 testing information: CALL your health at work right away after this visit to request testing.  Other testing options: [email or web address] OR [phone] Most pharmacies also offer testing and home test kits.  -I sent the medication(s) we discussed to your pharmacy: Meds ordered this encounter  Medications  . albuterol (PROAIR HFA) 108 (90 Base) MCG/ACT inhaler    Sig: Inhale 2 puffs into the lungs every 6 (six) hours as needed for wheezing or shortness of breath.    Dispense:  1 each    Refill:  0  . benzonatate (TESSALON PERLES) 100 MG capsule    Sig: Take 1 capsule (100 mg total) by mouth 3 (three) times daily as needed.    Dispense:  20 capsule    Refill:  0  . doxycycline (VIBRA-TABS) 100 MG tablet    Sig: Take 1 tablet (100 mg total) by mouth 2 (two) times daily.    Dispense:  20 tablet    Refill:  0     -COVID19 outpatient treatment center: [phone] (only call if your Covid test is positive and you are interested in monoclonal  antibody treatment which is available to those with risk factors within 10 days of symptom onset)  -can use tylenol or aleve if needed for fevers, aches and pains per instructions  -can use nasal saline a few times per day if nasal congestion, sometime a short course of Afrin nasal spray for 3 days can help as well  -stay hydrated, drink plenty of fluids and eat small healthy meals - avoid dairy  -can take 1000 IU Vit D3 and Vit C lozenges per instructions  -If the Covid test is positive, check out the CDC website for more information on home care, transmission and treatment for COVID19  -follow up with your doctor in 2-3 days unless improving and feeling better  -stay home while sick, except to seek medical care, and if you have COVID19 please stay home for a full 10 days since the onset of symptoms PLUS one day of no fever and feeling better.  It was nice to meet you today, and I really hope you are feeling better soon. I help [facility] out with telemedicine visits on Tuesdays and Thursdays and am available for visits on those days. If you have any concerns or questions following this visit please schedule a follow up visit with your Primary Care doctor or seek care at a local urgent care clinic to avoid delays in care.    Seek in person care promptly if your symptoms worsen, new concerns arise or you are not improving with treatment. Call 911 and/or seek emergency care if you symptoms are severe or life threatening.

## 2020-06-15 NOTE — Progress Notes (Signed)
Virtual Visit via Video Note  I connected with Teresa Harvey  on 06/15/20 at 12:00 PM EST by a video enabled telemedicine application and verified that I am speaking with the correct person using two identifiers.  Location patient: home, [place] Location provider:work or home office Persons participating in the virtual visit: patient, provider  I discussed the limitations of evaluation and management by telemedicine and the availability of in person appointments. The patient expressed understanding and agreed to proceed.   HPI:  Acute telemedicine visit for "a bad cold": -Onset: 06/09/20 -Symptoms include: cough, nasal congestion, sinus congestion - now persistent cough, some sob at times (works in hospital, wears mask all day and with coughing and wearing mask feels like it is difficult to breath at times), popping in the L ear, feels like has a "crackle" in her throat when she breaths, had emesis once form the cough, body aches -Denies:fevers, diarrhea, loss of taste or smell, inability to eat/drink or get out of bed, known sick contacts -Has tried: musinex, cold and flu, vics -Pertinent past medical history: seasonal allergies, obesity, has required inhaler in the past with URI -Pertinent medication allergies:penicillin -no chance of pregnancy, FDLMP 06/09/20 -COVID-19 vaccine status: vaccinated for flu; not vaccinated for covid  ROS: See pertinent positives and negatives per HPI.  Past Medical History:  Diagnosis Date  . Cervical dysplasia 2004   no treatment, normal paps after  . Eczema   . HSV infection   . Hx of gonorrhea   . Medical history non-contributory   . Pregnancy induced hypertension   . Vaginal Pap smear, abnormal     Past Surgical History:  Procedure Laterality Date  . CESAREAN SECTION N/A 03/21/2017   Procedure: Primary CESAREAN SECTION;  Surgeon: Shea Evans, MD;  Location: [facility] BIRTHING SUITES;  Service: Obstetrics;  Laterality: N/A;  EDD: 03/28/17 Allergy:  Penicillin   . COLPOSCOPY    . ESOPHAGOGASTRODUODENOSCOPY       Current Outpatient Medications:  .  albuterol (PROAIR HFA) 108 (90 Base) MCG/ACT inhaler, Inhale 2 puffs into the lungs every 6 (six) hours as needed for wheezing or shortness of breath., Disp: 1 each, Rfl: 0 .  benzonatate (TESSALON PERLES) 100 MG capsule, Take 1 capsule (100 mg total) by mouth 3 (three) times daily as needed., Disp: 20 capsule, Rfl: 0 .  doxycycline (VIBRA-TABS) 100 MG tablet, Take 1 tablet (100 mg total) by mouth 2 (two) times daily., Disp: 20 tablet, Rfl: 0 .  buPROPion (WELLBUTRIN XL) 150 MG 24 hr tablet, Take 1 tablet (150 mg total) by mouth daily., Disp: 90 tablet, Rfl: 3 .  citalopram (CELEXA) 40 MG tablet, Take 1 tablet (40 mg total) by mouth daily., Disp: 90 tablet, Rfl: 3 .  valACYclovir (VALTREX) 500 MG tablet, Take 500 mg by mouth daily., Disp: , Rfl: 3  EXAM:  VITALS per patient if applicable:  GENERAL: alert, oriented, appears well and in no acute distress  HEENT: atraumatic, conjunttiva clear, no obvious abnormalities on inspection of external nose and ears  NECK: normal movements of the head and neck  LUNGS: on inspection no signs of respiratory distress, breathing rate appears normal, no obvious gross SOB, gasping or wheezing  CV: no obvious cyanosis  MS: moves all visible extremities without noticeable abnormality  PSYCH/NEURO: pleasant and cooperative, no obvious depression or anxiety, speech and thought processing grossly intact  ASSESSMENT AND PLAN:  Discussed the following assessment and plan:  Cough  Nasal congestion  -we discussed possible serious and likely  etiologies, options for evaluation and workup, limitations of telemedicine visit vs in person visit, treatment, treatment risks and precautions. Pt prefers to treat via telemedicine empirically rather than in person at this moment.  Query viral illness, possible COVID-19, possible influenza versus other.  Given thick  mucus, question of a rattle which sounds like is coming from her throat from her report, but being able to examine due to video visit, also must consider LRI.  Advised Covid testing.  She works at hospital, so advise she contact her health at work immediately.  We discussed treatment options, potential complications, isolation and precautions.  Treatment center information provided in patient instructions.  Opted for Tessalon for cough, albuterol inhaler, as she feels this is helped in the past with respiratory illness and doxycycline 100 mg twice daily for 7 to 10 days after discussion risk/benefits and per her preference. Work/School slipped offered: provided in patient instructions  Scheduled follow up with PCP offered: Agrees to follow-up if needed Advised to seek prompt in person care if worsening, new symptoms arise, or if is not improving with treatment. Discussed options for inperson care if PCP office not available.    I discussed the assessment and treatment plan with the patient. The patient was provided an opportunity to ask questions and all were answered. The patient agreed with the plan and demonstrated an understanding of the instructions.     Teresa Koyanagi, DO

## 2020-06-16 NOTE — Telephone Encounter (Signed)
Pt had a virtual appointment with Dr Selena Batten today

## 2020-06-22 ENCOUNTER — Other Ambulatory Visit: Payer: Self-pay | Admitting: Family Medicine

## 2020-06-22 DIAGNOSIS — R2232 Localized swelling, mass and lump, left upper limb: Secondary | ICD-10-CM

## 2020-06-27 ENCOUNTER — Ambulatory Visit: Payer: Medicaid Other | Admitting: Family Medicine

## 2020-07-14 ENCOUNTER — Encounter: Payer: Self-pay | Admitting: Family Medicine

## 2020-07-14 NOTE — Telephone Encounter (Signed)
See note

## 2020-07-14 NOTE — Telephone Encounter (Signed)
Left voice massage to return call 

## 2020-07-15 ENCOUNTER — Telehealth: Payer: Self-pay

## 2020-07-15 NOTE — Telephone Encounter (Signed)
Nurse Assessment Nurse: Izora Ribas, RN, Melanie Date/Time Lamount Cohen Time): 07/14/2020 4:39:54 PM Confirm and document reason for call. If symptomatic, describe symptoms. ---She is having symptoms of a ectopic pregnancy. Found out last Friday. Pressure in lower abdomen, radiates up to stomach, cramps and lower back pain. No fever or bleeding. Decreased appetite. Acid reflux with drinking fluids. Does the patient have any new or worsening symptoms? ---Yes Will a triage be completed? ---Yes Related visit to physician within the last 2 weeks? ---No Does the PT have any chronic conditions? (i.e. diabetes, asthma, this includes High risk factors for pregnancy, etc.) ---Yes List chronic conditions. ---Last pregnancy was IVF Is the patient pregnant or possibly pregnant? (Ask all females between the ages of 69-55) ---Yes How many weeks gestation? ---5 What is the estimated delivery date? ---2021-03-16 Total number of pregnancies including current? ---2 Number of live births? ---1 Have you felt decreased fetal movement? ---Early Pregnancy - No Fetal Movement Felt Yet Is this a behavioral health or substance abuse call? ---No PLEASE NOTE: All timestamps contained within this report are represented as [place] Standard Time. CONFIDENTIALTY NOTICE: This fax transmission is intended only for the addressee. It contains information that is legally privileged, confidential or otherwise protected from use or disclosure. If you are not the intended recipient, you are strictly prohibited from reviewing, disclosing, copying using or disseminating any of this information or taking any action in reliance on or regarding this information. If you have received this fax in error, please notify us immediately by telephone so that we can arrange for its return to [place]. Phone: [phone], Toll-Free: [phone], Fax: [phone] Page: 2 of 2 Call Id: [id number] Guidelines Guideline Title Affirmed Question Affirmed Notes  Nurse Date/Time Lamount Cohen Time) Pregnancy - Abdominal Pain Less Than [redacted] Weeks EGA [1] Constant abdominal pain AND [2] present > 2 hours Izora Ribas, RN, Shawna Orleans 07/14/2020 4:42:38 PM Disp. Time Lamount Cohen Time) Disposition Final User 07/14/2020 4:28:57 PM Attempt made - message left Izora Ribas, RN, Shawna Orleans 07/14/2020 4:46:15 PM See HCP within 4 Hours (or PCP triage) Yes Izora Ribas, RN, Shawna Orleans Caller Disagree/Comply Comply Caller Understands Yes PreDisposition Call Doctor Care Advice Given Per Guideline SEE HCP (OR PCP TRIAGE) WITHIN 4 HOURS: * IF OFFICE WILL BE CLOSED AND NO PCP (PRIMARY CARE PROVIDER) SECOND-LEVEL TRIAGE: You need to be seen within the next 3 or 4 hours. A nearby Urgent Care Center [facility]) is often a good source of care. Another choice is to go to the ED. Go sooner if you become worse. CALL BACK IF: * You become worse CARE ADVICE given per Pregnancy - Abdominal Pain, under [redacted] Weeks EGA (Adult) guideline. Comments User: Ian Bushman Date/Time (Eastern Time): 07/14/2020 4:23:08 PM All of the information was given by the answering service, I didn't speak with the patient. 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 Referrals GO TO FACILITY OTHER - [facility]

## 2020-07-17 NOTE — Telephone Encounter (Signed)
See note

## 2020-08-01 ENCOUNTER — Other Ambulatory Visit: Payer: Medicaid Other

## 2020-08-11 ENCOUNTER — Other Ambulatory Visit (HOSPITAL_COMMUNITY): Payer: Self-pay | Admitting: Obstetrics & Gynecology

## 2020-08-11 MED FILL — VALACYCLOVIR HCL 500 MG TAB: 500 | 20 days supply | Qty: 40 | Fill #0

## 2020-08-14 ENCOUNTER — Encounter: Payer: Self-pay | Admitting: Family Medicine

## 2020-08-14 NOTE — Telephone Encounter (Signed)
See note

## 2020-08-15 ENCOUNTER — Ambulatory Visit
Admission: RE | Admit: 2020-08-15 | Discharge: 2020-08-15 | Disposition: A | Discharge status: Home / Self Care | Payer: Medicaid Other | Source: Ambulatory Visit | Attending: Family Medicine | Admitting: Family Medicine

## 2020-08-15 ENCOUNTER — Other Ambulatory Visit: Payer: Self-pay

## 2020-08-15 DIAGNOSIS — R2232 Localized swelling, mass and lump, left upper limb: Secondary | ICD-10-CM

## 2020-08-15 NOTE — Telephone Encounter (Signed)
Pt scheduled this appt

## 2020-08-28 ENCOUNTER — Ambulatory Visit: Payer: Medicaid Other | Admitting: Family Medicine

## 2020-08-28 ENCOUNTER — Encounter: Payer: Self-pay | Admitting: Family Medicine

## 2020-08-28 ENCOUNTER — Other Ambulatory Visit: Payer: Self-pay

## 2020-08-28 VITALS — BP 122/82 | HR 76 | Temp 97.5°F | Ht 66.0 in | Wt 223.0 lb

## 2020-08-28 DIAGNOSIS — Z3201 Encounter for pregnancy test, result positive: Secondary | ICD-10-CM | POA: Diagnosis not present

## 2020-08-28 DIAGNOSIS — Z789 Other specified health status: Secondary | ICD-10-CM

## 2020-08-28 DIAGNOSIS — N912 Amenorrhea, unspecified: Secondary | ICD-10-CM | POA: Diagnosis not present

## 2020-08-28 LAB — POCT URINE PREGNANCY: Preg Test, Ur: POSITIVE — AB

## 2020-08-28 NOTE — Progress Notes (Signed)
   Teresa Harvey is a 40 y.o. female who presents today for an office visit.  Assessment/Plan:  New/Acute Problems: Amenorrhea Urine pregnancy test here negative.  Patient is very surprised by this.  Had negative home pregnancy test 2 weeks ago though has been amenorrheic since December.  Will check beta hCG to confirm.  Depending on results will need referral back to OB/GYN.  It appears that urine pregnancy test was also negative she will come back to have a Nexplanon insertion as we previously planned.    Subjective:  HPI:  Patient here for Nexplanon insertion.  She does states she had a negative home pregnancy test 2 weeks ago.  Sexually active about a week ago.  Last menstrual period was less than 3 months ago.  She has been on birth control pills in the past        Objective:  Physical Exam: BP 122/82   Pulse 76   Temp (!) 97.5 F (36.4 C) (Temporal)   Ht 5\' 6"  (1.676 m)   Wt 223 lb (101.2 kg)   SpO2 99%   BMI 35.99 kg/m   Gen: No acute distress, resting comfortably Psych: Normal affect and thought content        Teresa Harvey M. , MD 08/28/2020 8:32 AM

## 2020-08-28 NOTE — Patient Instructions (Signed)
It was very nice to see you today!  We will check a blood pregnancy test today.  Take care, Dr [name]  PLEASE NOTE:  If you had any lab tests please let us know if you have not heard back within a few days. You may see your results on mychart before we have a chance to review them but we will give you a call once they are reviewed by [place]. If we ordered any referrals today, please let us know if you have not heard from their office within the next week.   Please try these tips to maintain a healthy lifestyle:   Eat at least 3 REAL meals and 1-2 snacks per day.  Aim for no more than 5 hours between eating.  If you eat breakfast, please do so within one hour of getting up.    Each meal should contain half fruits/vegetables, one quarter protein, and one quarter carbs (no bigger than a computer mouse)   Cut down on sweet beverages. This includes juice, soda, and sweet tea.     Drink at least 1 glass of water with each meal and aim for at least 8 glasses per day   Exercise at least 150 minutes every week.

## 2020-08-29 ENCOUNTER — Encounter: Payer: Self-pay | Admitting: Family Medicine

## 2020-08-29 NOTE — Progress Notes (Signed)
Can we check with the lab when we can expect the quantitative result to come back?  Katina Degree. [name], MD 08/29/2020 1:02 PM

## 2020-08-29 NOTE — Telephone Encounter (Signed)
See note

## 2020-08-30 LAB — HCG, SERUM, QUALITATIVE

## 2020-08-30 LAB — TEST AUTHORIZATION

## 2020-08-30 LAB — HCG, TOTAL, QUANTITATIVE: hCG, Beta Chain, Quant, S: 131 m[IU]/mL — ABNORMAL HIGH

## 2020-08-30 NOTE — Progress Notes (Signed)
Please inform patient of the following:  Her blood pregnancy test is positive. Recommend she follow up with OBGYN ASAP. We can place referral if needed.  Teresa Harvey. [name], MD 08/30/2020 8:11 AM

## 2020-09-11 ENCOUNTER — Other Ambulatory Visit: Payer: Medicaid Other

## 2020-10-24 ENCOUNTER — Emergency Department
Admission: EM | Admit: 2020-10-24 | Discharge: 2020-10-24 | Disposition: A | Discharge status: Home / Self Care | Payer: Medicaid Other | Source: Home / Self Care | Attending: Family Medicine | Admitting: Family Medicine

## 2020-10-24 ENCOUNTER — Encounter: Payer: Self-pay | Admitting: Emergency Medicine

## 2020-10-24 ENCOUNTER — Other Ambulatory Visit: Payer: Self-pay

## 2020-10-24 DIAGNOSIS — R059 Cough, unspecified: Secondary | ICD-10-CM

## 2020-10-24 DIAGNOSIS — J069 Acute upper respiratory infection, unspecified: Secondary | ICD-10-CM

## 2020-10-24 HISTORY — DX: Anxiety disorder, unspecified: F41.9

## 2020-10-24 HISTORY — DX: Depression, unspecified: F32.A

## 2020-10-24 MED ORDER — DOXYCYCLINE HYCLATE 100 MG PO TABS: 100.0000 mg | ORAL_TABLET | Freq: Two times a day (BID) | ORAL | 0 refills | Status: DC

## 2020-10-24 MED ORDER — PREDNISONE 20 MG PO TABS: ORAL_TABLET | ORAL | 0 refills | Status: DC

## 2020-10-24 NOTE — ED Provider Notes (Signed)
[facility]    CSN: [id number] Arrival date & time: 10/24/20  1650      History   Chief Complaint Chief Complaint  Patient presents with  . Cough    HPI Teresa Harvey is a 40 y.o. female.   About 10 days ago patient developed increased sinus congestion that has persisted.  Eight days ago she had a negative home COVID19 test.  Five days ago she developed fatigue, sore throat, tightness in her anterior chest, and nausea.  Yesterday she developed a cough.  She has an old albuterol inhaler that has been helpful. She had COVID19 in 2/21.  She has a family history of asthma (mother).  The history is provided by the patient.    Past Medical History:  Diagnosis Date  . Anxiety and depression   . Cervical dysplasia 2004   no treatment, normal paps after  . Eczema   . HSV infection   . Hx of gonorrhea   . Medical history non-contributory   . Pregnancy induced hypertension   . Vaginal Pap smear, abnormal     Patient Active Problem List   Diagnosis Date Noted  . Acne 05/25/2020  . Dyslipidemia 11/11/2018  . Anxiety 06/16/2018  . 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 Seasonal allergies 09/09/2014  . HSV infection     Past Surgical History:  Procedure Laterality Date  . CESAREAN SECTION N/A 03/21/2017   Procedure: Primary CESAREAN SECTION;  Surgeon: Shea Evans, MD;  Location: [facility]IR[facility]ING SU[facility];  Service: Obstetrics;  Laterality: N/A;  EDD: 03/28/17 Allergy: Penicillin   . COLPOSCOPY    . ESOPHAGOGASTRODUODENOSCOPY      OB History    Gravida  3   Para      Term      Preterm      AB  2   Living  0     SAB      IAB  2   Ectopic      Multiple      Live Births               Home Medications    Prior to Admission medications   Medication Sig Start Date End Date Taking? Authorizing Provider  predniSONE (DELTASONE) 20 MG tablet Take one tab by mouth twice daily for 4 days, then one daily. Take with food. 10/24/20  Yes  Lattie Haw, MD  albuterol (PROAIR HFA) 108 (90 Base) MCG/ACT inhaler Inhale 2 puffs into the lungs every 6 (six) hours as needed for wheezing or shortness of breath. 06/15/20   [name], DO  benzonatate (TESSALON PERLES) 100 MG capsule Take 1 capsule (100 mg total) by mouth 3 (three) times daily as needed. 06/15/20   [name], DO  buPROPion (WELLBUTRIN XL) 150 MG 24 hr tablet Take 1 tablet (150 mg total) by mouth daily. 05/25/20   Ardith Dark, MD  citalopram (CELEXA) 40 MG tablet TAKE 1 TABLET BY MOUTH DAILY 05/25/20 05/25/21  Ardith Dark, MD  doxycycline (VIBRA-TABS) 100 MG tablet Take 1 tablet (100 mg total) by mouth 2 (two) times daily. 10/24/20   Lattie Haw, MD  valACYclovir (VALTREX) 500 MG tablet Take 500 mg by mouth daily. 02/13/17   [provider]  valACYclovir (VALTREX) 500 MG tablet TAKE 1 TABLET BY MOUTH ONCE DAILY OR 2 TIMES DAILY FOR 5 DAYS FOR OUTBREAK 08/11/20 08/11/21  Shea Evans, MD    Family History Family History  Problem Relation Age  of Onset  . Heart disease Father   . Heart attack Father   . Eczema Father   . Hypertension Maternal Grandmother   . Cerebral palsy Maternal Grandmother   . Bipolar disorder Mother   . Asthma Mother   . Allergic rhinitis Neg Hx   . Angioedema Neg Hx   . Urticaria Neg Hx     Social History Social History   Tobacco Use  . Smoking status: Never Smoker  . Smokeless tobacco: Never Used  Substance Use Topics  . Alcohol use: No    Alcohol/week: 0.0 standard drinks  . Drug use: No     Allergies   Penicillins and Latex   Review of Systems Review of Systems + sore throat + cough No pleuritic pain but feels tight in anterior chest No wheezing + nasal congestion + post-nasal drainage No sinus pain/pressure No itchy/red eyes No earache No hemoptysis No SOB No fever/chills + nausea No vomiting No abdominal pain No diarrhea No urinary symptoms No skin rash + fatigue No myalgias +  headache Used OTC meds (Nyquil) without relief   Physical Exam Triage Vital Signs ED Triage Vitals  Enc Vitals Group     BP 10/24/20 1750 129/85     Pulse Rate 10/24/20 1750 90     Resp 10/24/20 1750 16     Temp 10/24/20 1750 98 F (36.7 C)     Temp Source 10/24/20 1750 Oral     SpO2 10/24/20 1750 100 %     Weight 10/24/20 1751 223 lb (101.2 kg)     Height 10/24/20 1751 5\' 6"  (1.676 m)     Head Circumference --      Peak Flow --      Pain Score 10/24/20 1751 3     Pain Loc --      Pain Edu? --      Excl. in GC? --    No data found.  Updated Vital Signs BP 129/85 (BP Location: Right Arm)   Pulse 90   Temp 98 F (36.7 C) (Oral)   Resp 16   Ht 5\' 6"  (1.676 m)   Wt 101.2 kg   LMP 10/10/2020 (Approximate)   SpO2 100%   BMI 35.99 kg/m   Visual Acuity Right Eye Distance:   Left Eye Distance:   Bilateral Distance:    Right Eye Near:   Left Eye Near:    Bilateral Near:     Physical Exam Nursing notes and Vital Signs reviewed. Appearance:  Patient appears stated age, and in no acute distress Eyes:  Pupils are equal, round, and reactive to light and accomodation.  Extraocular movement is intact.  Conjunctivae are not inflamed  Ears:  Canals normal.  Tympanic membranes normal.  Nose:  Mildly congested turbinates.  No sinus tenderness.  Pharynx:  Normal Neck:  Supple.  Mildly enlarged lateral nodes are present, tender to palpation on the left.   Lungs:  Clear to auscultation.  Breath sounds are equal.  Moving air well. Heart:  Regular rate and rhythm without murmurs, rubs, or gallops.  Abdomen:  Nontender without masses or hepatosplenomegaly.  Bowel sounds are present.  No CVA or flank tenderness.  Extremities:  No edema.  Skin:  No rash present.   UC Treatments / Results  Labs (all labs ordered are listed, but only abnormal results are displayed) Labs Reviewed  COVID-19, FLU A+B NAA    EKG   Radiology No results found.  Procedures Procedures (including  critical  care time)  Medications Ordered in UC Medications - No data to display  Initial Impression / Assessment and Plan / UC Course  I have reviewed the triage vital signs and the nursing notes.  Pertinent labs & imaging results that were available during my care of the patient were reviewed by me and considered in my medical decision making (see chart for details).    Patient has a history of mild reactive airways disease initiated by viral URI's.  Begin prednisone burst/taper and empiric doxycycline. COVID19 PCR and Flu A/B pending. Followup with Family Doctor if not improved in one week.    Final Clinical Impressions(s) / UC Diagnoses   Final diagnoses:  Cough  Viral URI with cough     Discharge Instructions     Take plain guaifenesin (1200mg  extended release tabs such as Mucinex) twice daily, with plenty of water, for cough and congestion.  May add Pseudoephedrine (30mg , one or two every 4 to 6 hours) for sinus congestion.  Get adequate rest.   May use Afrin nasal spray (or generic oxymetazoline) each morning for about 5 days and then discontinue.  Also recommend using saline nasal spray several times daily and saline nasal irrigation (AYR is a common brand).  Use Flonase nasal spray each morning after using Afrin nasal spray and saline nasal irrigation. Try warm salt water gargles for sore throat.  Stop all antihistamines (Nyquil, etc) for now, and other non-prescription cough/cold preparations. May take Delsym Cough Suppressant ("12 Hour Cough Relief") at bedtime for nighttime cough.  Continue albuterol inhaler as needed.   If your COVID-19 test is positive, isolate yourself for five days from today.  At the end of five days you may end isolation if your symptoms have cleared or improved, and you have not had a fever for 24 hours. At this time you should wear a mask for five more days when you are around others.  If symptoms become significantly worse during the night or  over the weekend, proceed to the local emergency room.       ED Prescriptions    Medication Sig Dispense Auth. Provider   doxycycline (VIBRA-TABS) 100 MG tablet Take 1 tablet (100 mg total) by mouth 2 (two) times daily. 14 tablet , MD   predniSONE (DELTASONE) 20 MG tablet Take one tab by mouth twice daily for 4 days, then one daily. Take with food. 12 tablet , MD        Lattie Haw, MD 10/27/20 [phone]

## 2020-10-24 NOTE — Discharge Instructions (Addendum)
Take plain guaifenesin (1200mg  extended release tabs such as Mucinex) twice daily, with plenty of water, for cough and congestion.  May add Pseudoephedrine (30mg , one or two every 4 to 6 hours) for sinus congestion.  Get adequate rest.   May use Afrin nasal spray (or generic oxymetazoline) each morning for about 5 days and then discontinue.  Also recommend using saline nasal spray several times daily and saline nasal irrigation (AYR is a common brand).  Use Flonase nasal spray each morning after using Afrin nasal spray and saline nasal irrigation. Try warm salt water gargles for sore throat.  Stop all antihistamines (Nyquil, etc) for now, and other non-prescription cough/cold preparations. May take Delsym Cough Suppressant ("12 Hour Cough Relief") at bedtime for nighttime cough.  Continue albuterol inhaler as needed.   If your COVID-19 test is positive, isolate yourself for five days from today.  At the end of five days you may end isolation if your symptoms have cleared or improved, and you have not had a fever for 24 hours. At this time you should wear a mask for five more days when you are around others.  If symptoms become significantly worse during the night or over the weekend, proceed to the local emergency room.

## 2020-10-24 NOTE — ED Triage Notes (Signed)
Patient here day 9 of congestion; cough started yesterday; used home rapid covid test about 8 days ago and it was negative; does have history of sinus infections; now complains of soreness in chest when she coughs and raw throat with some voice changes; 2 other family members ill and influenza going around her daughter's school. She did have covid 2/21; has not had covid vaccinations.

## 2020-10-27 LAB — COVID-19, FLU A+B NAA
Influenza A, NAA: NOT DETECTED
Influenza B, NAA: NOT DETECTED
SARS-CoV-2, NAA: NOT DETECTED

## 2020-11-08 ENCOUNTER — Ambulatory Visit: Payer: Medicaid Other | Admitting: Family Medicine

## 2020-12-27 ENCOUNTER — Ambulatory Visit: Payer: Medicaid Other | Admitting: Family Medicine

## 2020-12-27 ENCOUNTER — Other Ambulatory Visit (HOSPITAL_COMMUNITY)
Admission: RE | Admit: 2020-12-27 | Discharge: 2020-12-27 | Disposition: A | Discharge status: Home / Self Care | Payer: Medicaid Other | Source: Ambulatory Visit | Attending: Family Medicine | Admitting: Family Medicine

## 2020-12-27 ENCOUNTER — Encounter: Payer: Self-pay | Admitting: Family Medicine

## 2020-12-27 ENCOUNTER — Other Ambulatory Visit (HOSPITAL_COMMUNITY): Payer: Self-pay

## 2020-12-27 ENCOUNTER — Other Ambulatory Visit: Payer: Self-pay

## 2020-12-27 VITALS — BP 115/81 | HR 85 | Temp 98.0°F | Ht 66.0 in | Wt 230.6 lb

## 2020-12-27 DIAGNOSIS — M25562 Pain in left knee: Secondary | ICD-10-CM | POA: Diagnosis not present

## 2020-12-27 DIAGNOSIS — N898 Other specified noninflammatory disorders of vagina: Secondary | ICD-10-CM

## 2020-12-27 DIAGNOSIS — F419 Anxiety disorder, unspecified: Secondary | ICD-10-CM | POA: Diagnosis not present

## 2020-12-27 DIAGNOSIS — Z309 Encounter for contraceptive management, unspecified: Secondary | ICD-10-CM | POA: Diagnosis not present

## 2020-12-27 DIAGNOSIS — G8929 Other chronic pain: Secondary | ICD-10-CM

## 2020-12-27 MED ORDER — METRONIDAZOLE 500 MG PO TABS
500.0000 mg | ORAL_TABLET | Freq: Two times a day (BID) | ORAL | 0 refills | Status: AC
  Filled 2020-12-27: qty 14, 7d supply, fill #0

## 2020-12-27 NOTE — Progress Notes (Signed)
   Teresa Harvey is a 40 y.o. female who presents today for an office visit.  Assessment/Plan:  New/Acute Problems: Vaginal discharge Will empirically treat for BV based on patient's history with course of Flagyl.  Swab was obtained today.  Chronic Problems Addressed Today: Anxiety Doing well on Wellbutrin 150 mg daily and Celexa 40 mg daily.  Chronic pain of left knee Likely patellofemoral syndrome.  May have a small amount of underlying arthritis as well.  Discussed home exercises and handout was given.  We will check in with her in a few weeks when she comes back in for Nexplanon placement.  We will consider referral to PT if continues to be an issue.  Encounter for contraceptive management She is interested in Nexplanon.  She will come back to have this placed.  She is not interested in other birth control methods at this point.     Subjective:  HPI: CC of the patient is vaginal discharge. She states that the coloration is brown, and normally she experiences it 3 days, for 3 days, before she gets her period. She normally is prescribed doxycycline, but ends up with stomach cramps and genital irritation if she takes it for too long.   She expresses interest in acquiring a birth control implant.  She notes that her left knee is weak and has issues with standing for long periods of time, as pain can start. Additionally, she has issues with going down hills due to the weakness, and has had issues with standing up. She denies pain with palpation or lifting the leg up while sitting. She notices a grinding sensation during this lifting, which is present in both knees.         Objective:  Physical Exam: BP 115/81   Pulse 85   Temp 98 F (36.7 C) (Temporal)   Ht 5\' 6"  (1.676 m)   Wt 230 lb 9.6 oz (104.6 kg)   LMP 11/28/2020   SpO2 97%   BMI 37.22 kg/m   Gen: No acute distress, resting comfortably GU: Deferred MSK: Left knee without deformities.  Slight crepitus with  extension on active range of motion.  Neurovascular intact distally.  No joint line tenderness Neuro: Grossly normal, moves all extremities Psych: Normal affect and thought content      I,Teresa Harvey,acting as a scribe for 11/30/2020, MD.,have documented all relevant documentation on the behalf of Teresa Doe, MD,as directed by  Teresa Doe, MD while in the presence of Teresa Doe, MD.  I, Teresa Doe, MD, have reviewed all documentation for this visit. The documentation on 12/27/20 for the exam, diagnosis, procedures, and orders are all accurate and complete.  12/29/20. Katina Degree, MD 12/27/2020 8:53 AM

## 2020-12-27 NOTE — Assessment & Plan Note (Signed)
Likely patellofemoral syndrome.  May have a small amount of underlying arthritis as well.  Discussed home exercises and handout was given.  We will check in with her in a few weeks when she comes back in for Nexplanon placement.  We will consider referral to PT if continues to be an issue.

## 2020-12-27 NOTE — Assessment & Plan Note (Signed)
She is interested in Nexplanon.  She will come back to have this placed.  She is not interested in other birth control methods at this point.

## 2020-12-27 NOTE — Patient Instructions (Signed)
It was very nice to see you today!  Please start the metronidazole.  We will let you know if you need to make any medication adjustments.  Please work on exercises for your knee.  We will call you once we get your Nexplanon to have an appointment to come in and have it placed.  Take care, Dr [name]  PLEASE NOTE:  If you had any lab tests please let us know if you have not heard back within a few days. You may see your results on mychart before we have a chance to review them but we will give you a call once they are reviewed by [place]. If we ordered any referrals today, please let us know if you have not heard from their office within the next week.   Please try these tips to maintain a healthy lifestyle:  Eat at least 3 REAL meals and 1-2 snacks per day.  Aim for no more than 5 hours between eating.  If you eat breakfast, please do so within one hour of getting up.   Each meal should contain half fruits/vegetables, one quarter protein, and one quarter carbs (no bigger than a computer mouse)  Cut down on sweet beverages. This includes juice, soda, and sweet tea.   Drink at least 1 glass of water with each meal and aim for at least 8 glasses per day  Exercise at least 150 minutes every week.

## 2020-12-27 NOTE — Assessment & Plan Note (Signed)
Doing well on Wellbutrin 150 mg daily and Celexa 40 mg daily.

## 2020-12-28 LAB — CERVICOVAGINAL ANCILLARY ONLY
Bacterial Vaginitis (gardnerella): POSITIVE — AB
Candida Glabrata: NEGATIVE
Candida Vaginitis: POSITIVE — AB
Chlamydia: NEGATIVE
Comment: NEGATIVE
Comment: NEGATIVE
Comment: NEGATIVE
Comment: NEGATIVE
Comment: NEGATIVE
Comment: NORMAL
Neisseria Gonorrhea: NEGATIVE
Trichomonas: NEGATIVE

## 2020-12-31 NOTE — Progress Notes (Signed)
Please inform patient of the following:  Swab positive for BV and yeast infection. The flagyl should treat her BV but recommend a single dose of diflucan 150mg  once to clear up the yeast.  . Katina Degree, MD 12/31/2020 7:11 PM

## 2021-01-01 ENCOUNTER — Telehealth: Payer: Self-pay

## 2021-01-01 NOTE — Telephone Encounter (Signed)
Patient has been scheduled

## 2021-01-01 NOTE — Telephone Encounter (Signed)
LVM for patient to call back and schedule appt with Dr. [name] for Nexplanon. Appointment needs to be 40 mins.

## 2021-01-04 ENCOUNTER — Other Ambulatory Visit (HOSPITAL_COMMUNITY): Payer: Self-pay

## 2021-01-10 ENCOUNTER — Ambulatory Visit (INDEPENDENT_AMBULATORY_CARE_PROVIDER_SITE_OTHER): Payer: Medicaid Other | Admitting: Family Medicine

## 2021-01-10 ENCOUNTER — Other Ambulatory Visit (HOSPITAL_COMMUNITY): Payer: Self-pay

## 2021-01-10 ENCOUNTER — Other Ambulatory Visit: Payer: Self-pay

## 2021-01-10 ENCOUNTER — Ambulatory Visit: Payer: Medicaid Other | Admitting: Family Medicine

## 2021-01-10 ENCOUNTER — Encounter: Payer: Self-pay | Admitting: Family Medicine

## 2021-01-10 VITALS — BP 123/81 | HR 82 | Temp 97.8°F | Ht 66.0 in | Wt 232.6 lb

## 2021-01-10 DIAGNOSIS — F419 Anxiety disorder, unspecified: Secondary | ICD-10-CM

## 2021-01-10 DIAGNOSIS — G43809 Other migraine, not intractable, without status migrainosus: Secondary | ICD-10-CM | POA: Diagnosis not present

## 2021-01-10 DIAGNOSIS — Z30017 Encounter for initial prescription of implantable subdermal contraceptive: Secondary | ICD-10-CM | POA: Diagnosis not present

## 2021-01-10 DIAGNOSIS — Z309 Encounter for contraceptive management, unspecified: Secondary | ICD-10-CM

## 2021-01-10 DIAGNOSIS — B373 Candidiasis of vulva and vagina: Secondary | ICD-10-CM | POA: Diagnosis not present

## 2021-01-10 DIAGNOSIS — G43909 Migraine, unspecified, not intractable, without status migrainosus: Secondary | ICD-10-CM | POA: Insufficient documentation

## 2021-01-10 LAB — POCT URINE PREGNANCY: Preg Test, Ur: NEGATIVE

## 2021-01-10 MED ORDER — ETONOGESTREL 68 MG ~~LOC~~ IMPL
68.0000 mg | DRUG_IMPLANT | Freq: Once | SUBCUTANEOUS | Status: AC
  Administered 2021-01-10: 68 mg via SUBCUTANEOUS

## 2021-01-10 MED ORDER — FLUCONAZOLE 150 MG PO TABS
150.0000 mg | ORAL_TABLET | Freq: Once | ORAL | 0 refills | Status: AC
  Filled 2021-01-10: qty 1, 1d supply, fill #0

## 2021-01-10 NOTE — Assessment & Plan Note (Signed)
Stable on Wellbutrin 150 mg daily and Celexa 40 mg daily. 

## 2021-01-10 NOTE — Patient Instructions (Signed)
It was very nice to see you today!  We placed her Nexplanon today.  Please leave the bandage in place for the next day or so.  Please send a message in a month's weeks to let me know how this is working for you.  Take care, Dr [name]  PLEASE NOTE:  If you had any lab tests please let us know if you have not heard back within a few days. You may see your results on mychart before we have a chance to review them but we will give you a call once they are reviewed by [place]. If we ordered any referrals today, please let us know if you have not heard from their office within the next week.   Please try these tips to maintain a healthy lifestyle:  Eat at least 3 REAL meals and 1-2 snacks per day.  Aim for no more than 5 hours between eating.  If you eat breakfast, please do so within one hour of getting up.   Each meal should contain half fruits/vegetables, one quarter protein, and one quarter carbs (no bigger than a computer mouse)  Cut down on sweet beverages. This includes juice, soda, and sweet tea.   Drink at least 1 glass of water with each meal and aim for at least 8 glasses per day  Exercise at least 150 minutes every week.

## 2021-01-10 NOTE — Assessment & Plan Note (Signed)
Nexplanon placed today.  See below procedure note.  She tolerated well.  We can replace in 3 years.

## 2021-01-10 NOTE — Assessment & Plan Note (Signed)
May have some improvement with Nexplanon.  She will check with me over the next several weeks to months to let me know.  If continues to be an issue would consider starting preventative medication or referral to neurology.

## 2021-01-10 NOTE — Progress Notes (Signed)
   Teresa Harvey is a 40 y.o. female who presents today for an office visit.  Assessment/Plan:  New/Acute Problems: Candidal vaginitis Found on swab last week.  We will treat with one-time dose of Diflucan  Chronic Problems Addressed Today: Migraine May have some improvement with Nexplanon.  She will check with me over the next several weeks to months to let me know.  If continues to be an issue would consider starting preventative medication or referral to neurology.  Anxiety Stable on Wellbutrin 150 mg daily and Celexa 40 mg daily.  Encounter for contraceptive management Nexplanon placed today.  See below procedure note.  She tolerated well.  We can replace in 3 years.     Subjective:  HPI:  Patient is here today to discuss birth control.  She is interested in Nexplanon.  She is also noticed worsening migraines.  These are usually associated with her cycle.  Usually occurs on the left right side of her head.  Symptoms are overall stable.       Objective:  Physical Exam: BP 123/81   Pulse 82   Temp 97.8 F (36.6 C) (Temporal)   Ht 5\' 6"  (1.676 m)   Wt 232 lb 9.6 oz (105.5 kg)   LMP 01/01/2021   SpO2 97%   BMI 37.54 kg/m   Gen: No acute distress, resting comfortably CV: Regular rate and rhythm with no murmurs appreciated Pulm: Normal work of breathing, clear to auscultation bilaterally with no crackles, wheezes, or rhonchi Neuro: Grossly normal, moves all extremities Psych: Normal affect and thought content  Procedure Note: Patient gave  informed consent, time out was performed. Pregnancy test was negative. Appropriate time out taken.  Patient's left arm was prepped and draped in the usual sterile fashion.. The ruler used to measure and mark insertion area.  Pt was prepped with alcohol swab and then injected with 3 cc of 1% lidocaine with epinephrine.  Pt was prepped with betadine, Nexplanon removed form packaging,  Device confirmed in needle, then inserted full  length of needle and withdrawn per handbook instructions.  Pt insertion site covered with pressure bandage.   Minimal blood loss.  Pt tolerated the procedure well.       01/03/2021. Teresa Degree, MD 01/10/2021 12:12 PM

## 2021-01-15 ENCOUNTER — Encounter: Payer: Self-pay | Admitting: Family Medicine

## 2021-01-31 ENCOUNTER — Ambulatory Visit: Payer: Medicaid Other | Admitting: Family Medicine

## 2021-02-13 ENCOUNTER — Encounter: Payer: Self-pay | Admitting: Family Medicine

## 2021-02-16 ENCOUNTER — Encounter: Payer: Self-pay | Admitting: Emergency Medicine

## 2021-02-16 ENCOUNTER — Other Ambulatory Visit: Payer: Self-pay

## 2021-02-16 ENCOUNTER — Other Ambulatory Visit (HOSPITAL_COMMUNITY)
Admission: RE | Admit: 2021-02-16 | Discharge: 2021-02-16 | Disposition: A | Discharge status: Home / Self Care | Payer: Medicaid Other | Source: Ambulatory Visit | Attending: Family Medicine | Admitting: Family Medicine

## 2021-02-16 ENCOUNTER — Emergency Department
Admission: EM | Admit: 2021-02-16 | Discharge: 2021-02-16 | Disposition: A | Discharge status: Home / Self Care | Payer: Medicaid Other | Source: Home / Self Care

## 2021-02-16 DIAGNOSIS — B373 Candidiasis of vulva and vagina: Secondary | ICD-10-CM

## 2021-02-16 DIAGNOSIS — N898 Other specified noninflammatory disorders of vagina: Secondary | ICD-10-CM | POA: Diagnosis not present

## 2021-02-16 DIAGNOSIS — B3731 Acute candidiasis of vulva and vagina: Secondary | ICD-10-CM

## 2021-02-16 MED ORDER — METRONIDAZOLE 500 MG PO TABS: 500.0000 mg | ORAL_TABLET | Freq: Two times a day (BID) | ORAL | 0 refills | Status: AC

## 2021-02-16 MED ORDER — FLUCONAZOLE 150 MG PO TABS: 150.0000 mg | ORAL_TABLET | Freq: Every day | ORAL | 0 refills | Status: AC

## 2021-02-16 NOTE — Discharge Instructions (Addendum)
Advised/instructed patient to take medication as directed with food to completion advised patient we will follow-up with [facility] swab results once returned.  Advised patient to increase daily water intake while taking these medications.

## 2021-02-16 NOTE — ED Provider Notes (Signed)
[facility]    CSN: [id number] Arrival date & time: 02/16/21  1620      History   Chief Complaint Chief Complaint  Patient presents with   Vaginal Discharge    HPI Teresa Harvey is a 40 y.o. female.   HPI 40 year old female presents with vaginal discharge for 1 week, and vaginal itching for 1 week.  Patient reports Nexplanon placed 4 weeks ago and has had wet vaginal discharge since.  Patient reports is not been on birth COVID control since her early 17s, per patient was not offered tubal ligation by previous GYN.  Past Medical History:  Diagnosis Date   Anxiety and depression    Cervical dysplasia 2004   no treatment, normal paps after   Eczema    HSV infection    Hx of gonorrhea    Medical history non-contributory    Pregnancy induced hypertension    Vaginal Pap smear, abnormal     Patient Active Problem List   Diagnosis Date Noted   Migraine 01/10/2021   Chronic pain of left knee 12/27/2020   Encounter for contraceptive management 12/27/2020   Acne 05/25/2020   Dyslipidemia 11/11/2018   Anxiety 06/16/2018   Photosensitivity dermatitis 05/19/2018   Seasonal allergies 09/09/2014   HSV infection     Past Surgical History:  Procedure Laterality Date   CESAREAN SECTION N/A 03/21/2017   Procedure: Primary CESAREAN SECTION;  Surgeon: Shea Evans, MD;  Location: [facility] BIRTHING SUITES;  Service: Obstetrics;  Laterality: N/A;  EDD: 03/28/17 Allergy: Penicillin    COLPOSCOPY     ESOPHAGOGASTRODUODENOSCOPY      OB History     Gravida  3   Para      Term      Preterm      AB  2   Living  0      SAB      IAB  2   Ectopic      Multiple      Live Births               Home Medications    Prior to Admission medications   Medication Sig Start Date End Date Taking? Authorizing Provider  etonogestrel (NEXPLANON) 68 MG IMPL implant 1 each by Subdermal route once. 01/10/21  Yes [provider]  fluconazole (DIFLUCAN) 150  MG tablet Take 1 tablet (150 mg total) by mouth daily. 02/16/21  Yes Trevor Iha, FNP  metroNIDAZOLE (FLAGYL) 500 MG tablet Take 1 tablet (500 mg total) by mouth 2 (two) times daily for 7 days. 02/16/21 02/23/21 Yes Trevor Iha, FNP  albuterol (PROAIR HFA) 108 (90 Base) MCG/ACT inhaler Inhale 2 puffs into the lungs every 6 (six) hours as needed for wheezing or shortness of breath. 06/15/20   [name], DO  buPROPion (WELLBUTRIN XL) 150 MG 24 hr tablet Take 1 tablet (150 mg total) by mouth daily. Patient not taking: Reported on 02/16/2021 05/25/20   Ardith Dark, MD  citalopram (CELEXA) 40 MG tablet TAKE 1 TABLET BY MOUTH DAILY Patient not taking: Reported on 02/16/2021 05/25/20 05/25/21  Ardith Dark, MD  valACYclovir (VALTREX) 500 MG tablet Take 500 mg by mouth daily. Patient not taking: Reported on 02/16/2021 02/13/17   [provider]  valACYclovir (VALTREX) 500 MG tablet TAKE 1 TABLET BY MOUTH ONCE DAILY OR 2 TIMES DAILY FOR 5 DAYS FOR OUTBREAK 08/11/20 08/11/21  Shea Evans, MD    Family History Family History  Problem Relation Age  of Onset   Bipolar disorder Mother    Asthma Mother    Heart disease Father    Heart attack Father    Eczema Father    Hypertension Maternal Grandmother    Cerebral palsy Maternal Grandmother    Allergic rhinitis Neg Hx    Angioedema Neg Hx    Urticaria Neg Hx     Social History Social History   Tobacco Use   Smoking status: Never   Smokeless tobacco: Never  Substance Use Topics   Alcohol use: No    Alcohol/week: 0.0 standard drinks   Drug use: No     Allergies   Penicillins and Latex   Review of Systems Review of Systems  Genitourinary:  Positive for vaginal discharge.       Vaginal itching  All other systems reviewed and are negative.   Physical Exam Triage Vital Signs ED Triage Vitals  Enc Vitals Group     BP 02/16/21 1636 120/79     Pulse Rate 02/16/21 1636 85     Resp 02/16/21 1636 16     Temp 02/16/21 1636 98.7  F (37.1 C)     Temp Source 02/16/21 1636 Oral     SpO2 02/16/21 1636 100 %     Weight 02/16/21 1638 220 lb (99.8 kg)     Height 02/16/21 1638 5\' 6"  (1.676 m)     Head Circumference --      Peak Flow --      Pain Score 02/16/21 1638 0     Pain Loc --      Pain Edu? --      Excl. in GC? --    No data found.  Updated Vital Signs BP 120/79 (BP Location: Right Arm)   Pulse 85   Temp 98.7 F (37.1 C) (Oral)   Resp 16   Ht 5\' 6"  (1.676 m)   Wt 220 lb (99.8 kg)   SpO2 100%   Breastfeeding No   BMI 35.51 kg/m    Physical Exam Vitals and nursing note reviewed.  Constitutional:      General: She is not in acute distress.    Appearance: She is obese. She is not ill-appearing.  HENT:     Head: Normocephalic and atraumatic.     Mouth/Throat:     Mouth: Mucous membranes are moist.     Pharynx: Oropharynx is clear.  Eyes:     Extraocular Movements: Extraocular movements intact.     Conjunctiva/sclera: Conjunctivae normal.     Pupils: Pupils are equal, round, and reactive to light.  Cardiovascular:     Rate and Rhythm: Normal rate and regular rhythm.     Pulses: Normal pulses.     Heart sounds: Normal heart sounds.  Pulmonary:     Effort: Pulmonary effort is normal.     Breath sounds: Normal breath sounds.     Comments: No adventitious breath sounds noted Musculoskeletal:        General: Normal range of motion.     Cervical back: Normal range of motion and neck supple. No tenderness.  Lymphadenopathy:     Cervical: No cervical adenopathy.  Skin:    General: Skin is warm and dry.  Neurological:     General: No focal deficit present.     Mental Status: She is alert and oriented to person, place, and time. Mental status is at baseline.  Psychiatric:        Mood and Affect: Mood normal.  Behavior: Behavior normal.        Thought Content: Thought content normal.     UC Treatments / Results  Labs (all labs ordered are listed, but only abnormal results are  displayed) Labs Reviewed  CERVICOVAGINAL ANCILLARY ONLY    EKG   Radiology No results found.  Procedures Procedures (including critical care time)  Medications Ordered in UC Medications - No data to display  Initial Impression / Assessment and Plan / UC Course  I have reviewed the triage vital signs and the nursing notes.  Pertinent labs & imaging results that were available during my care of the patient were reviewed by me and considered in my medical decision making (see chart for details).     MDM: 1.  Vaginal discharge-will treat empirically with Flagyl while awaiting Aptima swab results; 2.  Vaginal candidiasis-we will treat empirically with Diflucan while awaiting Aptima swab results.  Patient discharged home, hemodynamically stable.  Final Clinical Impressions(s) / UC Diagnoses   Final diagnoses:  Vaginal discharge  Vaginal yeast infection     Discharge Instructions      Advised/instructed patient to take medication as directed with food to completion advised patient we will follow-up with Aptima swab results once returned.  Advised patient to increase daily water intake while taking these medications.     ED Prescriptions     Medication Sig Dispense Auth. Provider   metroNIDAZOLE (FLAGYL) 500 MG tablet Take 1 tablet (500 mg total) by mouth 2 (two) times daily for 7 days. 14 tablet Trevor Iha, FNP   fluconazole (DIFLUCAN) 150 MG tablet Take 1 tablet (150 mg total) by mouth daily. 4 tablet Trevor Iha, FNP      PDMP not reviewed this encounter.   Trevor Iha, FNP 02/16/21 1713

## 2021-02-16 NOTE — ED Triage Notes (Signed)
Pt has had vaginal discharge x 1 week Itching per pt x 1 week  Pt had an implant (nexplanon) placed 4 weeks ago & has had vaginal discharge sinc ethen  Per pt she has not been on birth control since she was in her 32's  Pt was not offered a tubal ligation by previous GYN

## 2021-02-20 ENCOUNTER — Other Ambulatory Visit: Payer: Self-pay

## 2021-02-20 ENCOUNTER — Other Ambulatory Visit (HOSPITAL_COMMUNITY): Payer: Self-pay

## 2021-02-20 ENCOUNTER — Ambulatory Visit: Payer: Medicaid Other | Admitting: Family Medicine

## 2021-02-20 VITALS — BP 109/77 | HR 92 | Temp 97.7°F | Ht 66.0 in | Wt 230.6 lb

## 2021-02-20 DIAGNOSIS — Z309 Encounter for contraceptive management, unspecified: Secondary | ICD-10-CM

## 2021-02-20 DIAGNOSIS — N898 Other specified noninflammatory disorders of vagina: Secondary | ICD-10-CM

## 2021-02-20 LAB — CERVICOVAGINAL ANCILLARY ONLY
Bacterial Vaginitis (gardnerella): POSITIVE — AB
Candida Glabrata: NEGATIVE
Candida Vaginitis: POSITIVE — AB
Comment: NEGATIVE
Comment: NEGATIVE
Comment: NEGATIVE

## 2021-02-20 MED ORDER — NAPROXEN 500 MG PO TABS
500.0000 mg | ORAL_TABLET | Freq: Two times a day (BID) | ORAL | 0 refills | Status: AC | PRN
  Filled 2021-02-20: qty 30, 15d supply, fill #0

## 2021-02-20 NOTE — Assessment & Plan Note (Signed)
Extensive discussion with patient today regarding her birth control options.  She initially was very adamant that she wanted Nexplanon removed due to being told by the nurse practitioner in urgent care that it was causing all of her side effects and that a person her age should not have a nexplanon.  Discussed with patient symptoms usually subside within the first couple of months and that it is important that we prevent unintended pregnancies as she had one earlier this year.  She is not interested in alternative forms of contraception at this point.  She may be interested in tubal ligation at this point.  We will place referral to gynecology for further discussion.  She will leave Nexplanon in place until she follows up with gynecology however she will let me know if she would like to have removed in the meantime.

## 2021-02-20 NOTE — Progress Notes (Addendum)
Teresa Harvey is a 40 y.o. female who presents today for an office visit.  Assessment/Plan:  New/Acute Problems: Cramping Likely side effect of Nexplanon.  Had extensive discussion with patient regarding side effects of Nexplanon and that things usually subside within the first few months after placement.  She has been taking ibuprofen for the cramping.  We will start naproxen.  Hopefully will have some improvement over the course of the next several weeks  Vaginal discharge Seems to be improving.  Her test results are not back however she is already being empirically treated with Flagyl and Diflucan.  Chronic Problems Addressed Today: Encounter for contraceptive management Extensive discussion with patient today regarding her birth control options.  She initially was very adamant that she wanted Nexplanon removed due to being told by the nurse practitioner in urgent care that it was causing all of her side effects and that a person her age should not have a nexplanon.  Discussed with patient symptoms usually subside within the first couple of months and that it is important that we prevent unintended pregnancies as she had one earlier this year.  She is not interested in alternative forms of contraception at this point.  She may be interested in tubal ligation at this point.  We will place referral to gynecology for further discussion.  She will leave Nexplanon in place until she follows up with gynecology however she will let me know if she would like to have removed in the meantime.     Subjective:  HPI:  She is here to discuss about side effect from birth control. She is currently on Nexplanon. She complains of bloating, cramping and bleeding. Nexplanon was placed on 01/10/2021. She also reports of vaginal discharge/itching and associate it with yeast infection. She described it as reddish brown. She state she feel bloated after eating normal meal.She took ibuprofen for the cramping  and state it did help with the symptoms. She described cramping pain as menstrual cramping.   She went to urgent care on 02/16/2021 for the similar symptoms. She was given Flagyl and Diflucan empirically and was told by the nurse practitioner that she needed to have Nexplanon removed ASAP and that it should have never been planed. Aptima swab test was done as well. She is interested for tubal ligation.        Objective:  Physical Exam: BP 109/77   Pulse 92   Temp 97.7 F (36.5 C) (Temporal)   Ht 5\' 6"  (1.676 m)   Wt 230 lb 9.6 oz (104.6 kg)   LMP 02/02/2021   SpO2 97%   BMI 37.22 kg/m   Gen: No acute distress, resting comfortably Neuro: Grossly normal, moves all extremities Psych: Normal affect and thought content       I,Savera Zaman,acting as a scribe for 02/04/2021, MD.,have documented all relevant documentation on the behalf of Teresa Doe, MD,as directed by  Teresa Doe, MD while in the presence of Teresa Doe, MD.   I, Teresa Doe, MD, have reviewed all documentation for this visit. The documentation on 02/20/21 for the exam, diagnosis, procedures, and orders are all accurate and complete.  Time Spent: 45 minutes of total time was spent on the date of the encounter performing the following actions: chart review prior to seeing the patient, obtaining history, performing a medically necessary exam, counseling on the treatment plan including side effects of nexplanon, placing orders, and documenting in our EHR.    02/22/21. Katina Degree, MD 02/20/2021 8:42  AM

## 2021-02-20 NOTE — Patient Instructions (Signed)
It was very nice to see you today!  I will place referral for you to see a gynecologist to discuss tubal ligation.  Your symptoms should get better with the Nexplanon over the next several weeks.  Please let me know if you would like for [place] to have it removed.  Please take the naproxen for cramping.  Take care, Dr [name]  PLEASE NOTE:  If you had any lab tests please let us know if you have not heard back within a few days. You may see your results on mychart before we have a chance to review them but we will give you a call once they are reviewed by [place]. If we ordered any referrals today, please let us know if you have not heard from their office within the next week.   Please try these tips to maintain a healthy lifestyle:  Eat at least 3 REAL meals and 1-2 snacks per day.  Aim for no more than 5 hours between eating.  If you eat breakfast, please do so within one hour of getting up.   Each meal should contain half fruits/vegetables, one quarter protein, and one quarter carbs (no bigger than a computer mouse)  Cut down on sweet beverages. This includes juice, soda, and sweet tea.   Drink at least 1 glass of water with each meal and aim for at least 8 glasses per day  Exercise at least 150 minutes every week.

## 2021-02-22 ENCOUNTER — Encounter: Payer: Self-pay | Admitting: Family Medicine

## 2021-02-22 NOTE — Telephone Encounter (Signed)
Scheduled in next available but she would like to get it out sooner is there any way we can work patient in another day sooner or can we schedule with another provider  Please advise

## 2021-02-22 NOTE — Telephone Encounter (Signed)
Please schedule patient.

## 2021-02-27 ENCOUNTER — Encounter: Payer: Self-pay | Admitting: Family Medicine

## 2021-02-27 NOTE — Telephone Encounter (Signed)
Pt called back wanting to know if she can come in before 9/26. Can pt be worked in? Please Advise.

## 2021-02-28 ENCOUNTER — Other Ambulatory Visit (HOSPITAL_COMMUNITY): Payer: Self-pay

## 2021-03-01 NOTE — Telephone Encounter (Signed)
Ok to schedule early if any opening

## 2021-03-02 NOTE — Telephone Encounter (Signed)
Nothing available before 9/26.

## 2021-03-05 ENCOUNTER — Other Ambulatory Visit: Payer: Self-pay

## 2021-03-05 ENCOUNTER — Encounter: Payer: Self-pay | Admitting: Family Medicine

## 2021-03-05 ENCOUNTER — Ambulatory Visit: Payer: Medicaid Other | Admitting: Family Medicine

## 2021-03-05 VITALS — BP 110/62 | HR 90 | Temp 97.9°F | Ht <= 58 in | Wt 229.2 lb

## 2021-03-05 DIAGNOSIS — N760 Acute vaginitis: Secondary | ICD-10-CM

## 2021-03-05 DIAGNOSIS — Z3046 Encounter for surveillance of implantable subdermal contraceptive: Secondary | ICD-10-CM | POA: Diagnosis not present

## 2021-03-05 DIAGNOSIS — Z309 Encounter for contraceptive management, unspecified: Secondary | ICD-10-CM

## 2021-03-05 NOTE — Progress Notes (Signed)
   Teresa Harvey is a 40 y.o. female who presents today for an office visit.  Assessment/Plan:  Vaginitis Has completed course of Flagyl and Diflucan.  Will be following up with gynecology  Encounter for contraception management Nexplanon removed as noted below.  Advised patient to use backup contraception until she follows up with OB/GYN to discuss permanent options.     Subjective:  HPI:  Patient here for Nexplanon removal.  She has had continued issues with recurrent BV and irregular cycles.  Nexplanon was placed about 2 months ago.  She would like to see a gynecologist to discuss alternatives.       Objective:  Physical Exam: BP 110/62   Pulse 90   Temp 97.9 F (36.6 C) (Temporal)   Ht 1' (0.305 m)   Wt 229 lb 3.2 oz (104 kg)   SpO2 100%   BMI 1119.06 kg/m   Gen: No acute distress, resting comfortably Neuro: Grossly normal, moves all extremities Psych: Normal affect and thought content  Procedure Note  Removal Patient given informed consent for removal of her Nexplanon, time out was performed.  Signed copy in the chart.  Appropriate time out taken. Nexplanon site identified.  Area prepped in usual sterile fashon. One cc of 1% lidocaine was used to anesthetize the area at the distal end of the implant. A small stab incision was made beside the implant on the distal portion.  The nexplanon rod was grasped using hemostats and removed without difficulty.  There was less than 3 cc blood loss. There were no complications.  A small amount of antibiotic ointment and steri-strips were applied over the small incision.  A pressure bandage was applied to reduce any bruising.  The patient tolerated the procedure well and was given post procedure instructions.       Teresa Harvey. Teresa Ralph, MD 03/05/2021 1:28 PM

## 2021-03-05 NOTE — Patient Instructions (Signed)
It was very nice to see you today!  We took out your nexplanon today.  Please call the OBGYN at [phone] to schedule an appointment.   Take care, Dr [name]  PLEASE NOTE:  If you had any lab tests please let us know if you have not heard back within a few days. You may see your results on mychart before we have a chance to review them but we will give you a call once they are reviewed by [place]. If we ordered any referrals today, please let us know if you have not heard from their office within the next week.   Please try these tips to maintain a healthy lifestyle:  Eat at least 3 REAL meals and 1-2 snacks per day.  Aim for no more than 5 hours between eating.  If you eat breakfast, please do so within one hour of getting up.   Each meal should contain half fruits/vegetables, one quarter protein, and one quarter carbs (no bigger than a computer mouse)  Cut down on sweet beverages. This includes juice, soda, and sweet tea.   Drink at least 1 glass of water with each meal and aim for at least 8 glasses per day  Exercise at least 150 minutes every week.

## 2021-03-06 ENCOUNTER — Ambulatory Visit: Payer: Medicaid Other | Admitting: Family Medicine

## 2021-03-19 ENCOUNTER — Ambulatory Visit: Payer: Self-pay

## 2022-03-04 ENCOUNTER — Encounter: Payer: Self-pay | Admitting: *Deleted

## 2022-05-23 ENCOUNTER — Encounter: Payer: Self-pay | Admitting: *Deleted

## 2023-01-17 NOTE — Telephone Encounter (Signed)
Noted PCP deleted
